# Patient Record
Sex: Male | Born: 1975 | Race: White | Hispanic: No | Marital: Married | Smoking: Current some day smoker
Health system: Southern US, Community
[De-identification: ages and names within clinical notes are randomized; demographics above are authoritative.]

## PROBLEM LIST (undated history)

## (undated) DIAGNOSIS — H04123 Dry eye syndrome of bilateral lacrimal glands: Secondary | ICD-10-CM

## (undated) DIAGNOSIS — G4733 Obstructive sleep apnea (adult) (pediatric): Secondary | ICD-10-CM

## (undated) DIAGNOSIS — I480 Paroxysmal atrial fibrillation: Secondary | ICD-10-CM

## (undated) DIAGNOSIS — N200 Calculus of kidney: Secondary | ICD-10-CM

## (undated) HISTORY — PX: SPINE SURGERY: SHX786

## (undated) HISTORY — DX: Calculus of kidney: N20.0

## (undated) HISTORY — DX: Paroxysmal atrial fibrillation: I48.0

## (undated) HISTORY — PX: OTHER SURGICAL HISTORY: SHX169

## (undated) HISTORY — PX: KNEE ARTHROSCOPY WITH MENISCAL REPAIR: SHX5653

## (undated) HISTORY — DX: Dry eye syndrome of bilateral lacrimal glands: H04.123

## (undated) HISTORY — DX: Obstructive sleep apnea (adult) (pediatric): G47.33

---

## 2000-02-27 ENCOUNTER — Ambulatory Visit (HOSPITAL_COMMUNITY): Admission: RE | Admit: 2000-02-27 | Discharge: 2000-02-27 | Payer: Self-pay | Admitting: Family Medicine

## 2002-07-31 ENCOUNTER — Encounter: Admission: RE | Admit: 2002-07-31 | Discharge: 2002-07-31 | Payer: Self-pay | Admitting: Family Medicine

## 2002-07-31 ENCOUNTER — Encounter: Payer: Self-pay | Admitting: Family Medicine

## 2004-07-21 ENCOUNTER — Emergency Department (HOSPITAL_COMMUNITY): Admission: EM | Admit: 2004-07-21 | Discharge: 2004-07-21 | Payer: Self-pay | Admitting: Emergency Medicine

## 2005-04-17 ENCOUNTER — Ambulatory Visit (HOSPITAL_BASED_OUTPATIENT_CLINIC_OR_DEPARTMENT_OTHER): Admission: RE | Admit: 2005-04-17 | Discharge: 2005-04-17 | Payer: Self-pay | Admitting: Orthopedic Surgery

## 2005-04-17 ENCOUNTER — Ambulatory Visit (HOSPITAL_COMMUNITY): Admission: RE | Admit: 2005-04-17 | Discharge: 2005-04-17 | Payer: Self-pay | Admitting: Orthopedic Surgery

## 2005-06-01 ENCOUNTER — Emergency Department (HOSPITAL_COMMUNITY): Admission: EM | Admit: 2005-06-01 | Discharge: 2005-06-01 | Payer: Self-pay | Admitting: *Deleted

## 2005-06-17 ENCOUNTER — Emergency Department (HOSPITAL_COMMUNITY): Admission: EM | Admit: 2005-06-17 | Discharge: 2005-06-17 | Payer: Self-pay | Admitting: *Deleted

## 2005-07-10 ENCOUNTER — Ambulatory Visit (HOSPITAL_COMMUNITY): Admission: RE | Admit: 2005-07-10 | Discharge: 2005-07-10 | Payer: Self-pay | Admitting: Urology

## 2005-07-10 ENCOUNTER — Ambulatory Visit (HOSPITAL_BASED_OUTPATIENT_CLINIC_OR_DEPARTMENT_OTHER): Admission: RE | Admit: 2005-07-10 | Discharge: 2005-07-10 | Payer: Self-pay | Admitting: Urology

## 2007-05-01 ENCOUNTER — Encounter: Admission: RE | Admit: 2007-05-01 | Discharge: 2007-05-01 | Payer: Self-pay | Admitting: Sports Medicine

## 2007-05-17 ENCOUNTER — Encounter: Admission: RE | Admit: 2007-05-17 | Discharge: 2007-05-17 | Payer: Self-pay | Admitting: Sports Medicine

## 2011-01-06 NOTE — Op Note (Signed)
NAMEFUQUAN, Isaac [name]                ACCOUNT NO.:  [id number]   MEDICAL RECORD NO.:  [id number]          PATIENT TYPE:  AMB   LOCATION:  [facility]                          FACILITY:  [facility]   PHYSICIAN:  Robert A. Thurston Hole, M.D. 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   PROCEDURE:  1.  Left knee examination under anesthesia followed by arthroscopic partial      medial meniscectomy.  2.  Left knee patellofemoral chondroplasty.   SURGEON:  Dr. Salvatore Marvel.   ASSISTANT:  Kirstin Tomasa Rand, P.A.   ANESTHESIA:  General.   OPERATIVE TIME:  30 minutes.   COMPLICATIONS:  None.   INDICATION FOR PROCEDURE:  [name] is a 35 year old gentleman who injured  his left knee March 08, 2005, with a twisting injury.  Significant pain was  examined, MRI documenting medial meniscus tear.  He has had significant pain  and persistent problems with locking and catching with this meniscal tear  confirmed by MRI and is now to undergo arthroscopy.   DESCRIPTION:  [name] was brought to the operating room on April 17, 2005, placed on the operative table in the supine position.  After an  adequate level of general anesthesia was obtained his left knee was  examined.  He had full range of motion and his knee was stable to  ligamentous exam with normal patella tracking.  Left leg was sterilely  injected with 0.25% Marcaine with epinephrine.  Left knee and leg was then  prepped using sterile DuraPrep and draped using sterile technique.  Originally, through an anterolateral portal the arthroscope with a pump  attached was placed and through an anteromedial portal an arthroscopic probe  was placed.  On initial inspection in  medial compartment, the articular  cartilage showed grade 1 and 2 chondromalacia.  The medial meniscus showed  tear of the posterior horn and medial horn, of which 50% was resected back  to a stable rim.  Intercondylar notch inspected, anterior and posterior  cruciate ligaments were normal.  The lateral compartment inspected.  The  articular cartilage was normal, lateral meniscus was normal.  Patellofemoral  joint showed a distinct grade 3 chondral defect on the medial patella facet  and this was debrided, the rest of the patellofemoral joint articular  cartilage was normal, the patella tracked normally.  Medial and lateral  gutters were free of pathology.  After this was done, it was felt that all  pathology had been satisfactorily addressed.  The instruments were removed.  The portal was closed with a 3-0 nylon suture and injected with 0.25%  Marcaine with epinephrine and 4 mg of morphine.  A sterile dressing was  applied and the patient awakened and taken to the recovery room in stable  condition.   FOLLOWUP CARE:  [name] will be followed as an outpatient on  Talwin NX  and Naprosyn.  I will see him back in the office in a week for sutures and  followup.      Robert A. Thurston Hole, M.D.  Electronically Signed     RAW/MEDQ  D:  04/17/2005  T:  04/17/2005  Job:  [id number]

## 2011-01-06 NOTE — Op Note (Signed)
[name], Isaac [name]                ACCOUNT NO.:  [id number]   MEDICAL RECORD NO.:  [id number]          PATIENT TYPE:  AMB   LOCATION:  [facility]                         FACILITY:  [facility]   PHYSICIAN:  Sigmund I. Patsi Sears, M.D.DATE OF BIRTH:  04-19-1976   DATE OF PROCEDURE:  07/10/2005  DATE OF DISCHARGE:                                 OPERATIVE REPORT   PREOPERATIVE DIAGNOSES:  Impacted distal right ureteral calculus x2.   POSTOPERATIVE DIAGNOSES:  Impacted distal right ureteral calculus x2.   OPERATION:  Cystourethroscopy, right retrograde pyelogram with  interpretation, balloon dilation of right lower ureter, right ureteroscopy,  basket extraction of multiple right ureteral calculi.   SURGEON:  Sigmund I. Patsi Sears, M.D.   ANESTHESIA:  General LMA.   PREPARATION:  After appropriate preanesthesia, the patient is brought to the  operating room, placed on the operating table in dorsal supine position  where general LMA anesthesia was introduced. He was then replaced in dorsal  lithotomy position where the pubis was prepped with Betadine solution and  draped in the usual fashion.Marland Kitchen   REVIEW OF HISTORY:  This 35 year old male has a history of multiple right  lower ureteral calculi, which have become impacted, and have not passed. He  is now for basket extraction.   PROCEDURE:  Cystourethroscopy was accomplished which showed a normal penis,  normal urethra, normal prostatic fossa. The bladder base was normal with  edema of the right ureteral orifice. The left ureteral orifice was normal  with clear efflux on the left side. No reflux was seen from the right side.   Right retrograde pyelogram was performed which showed two stones in the  right lower ureter, in the region of the right ureterovesical junction.    The 4 cm balloon dilator was then passed into the right lower ureter and  ureteral orifice, and the ureteral orifices balloon dilated for 3 minutes.  Right ureteroscopy was  then accomplished, and showed three separate stones,  which were each basket extracted individually. One stone popped off the  basket after removal from the penis, and was not recovered at the time of  dictation. The other two stones are sent to laboratory for evaluation. Photo  documentation of the stones was undertaken.   The patient was given a B&O suppository at the beginning of the procedure  and IV Toradol at the end of the procedure. He was awakened and taken to the  recovery room in good condition.   Note, the patient requested no double-J catheter, it was felt that because  of the patient's distal ureteral calculus, and because it was removed easily  after balloon dilation and minimal scoping was necessary, that he may be  able to get by without a double-J catheter.      Sigmund I. Patsi Sears, M.D.  Electronically Signed     SIT/MEDQ  D:  07/10/2005  T:  07/10/2005  Job:  [id number]

## 2014-10-14 ENCOUNTER — Encounter: Payer: Self-pay | Admitting: Sports Medicine

## 2014-10-14 ENCOUNTER — Ambulatory Visit (INDEPENDENT_AMBULATORY_CARE_PROVIDER_SITE_OTHER): Payer: 59 | Admitting: Sports Medicine

## 2014-10-14 VITALS — BP 133/93 | Ht 73.0 in | Wt 225.0 lb

## 2014-10-14 DIAGNOSIS — S29009A Unspecified injury of muscle and tendon of unspecified wall of thorax, initial encounter: Secondary | ICD-10-CM

## 2014-10-14 DIAGNOSIS — S29019A Strain of muscle and tendon of unspecified wall of thorax, initial encounter: Secondary | ICD-10-CM

## 2014-10-14 MED ORDER — METHYLPREDNISOLONE ACETATE 80 MG/ML IJ SUSP
80.0000 mg | Freq: Once | INTRAMUSCULAR | Status: AC
  Administered 2014-10-14: 80 mg via INTRAMUSCULAR

## 2014-10-14 NOTE — Progress Notes (Signed)
   Subjective:    Patient ID: Isaac [name], male    DOB: 04/15/1976, 39 y.o.   MRN: [id number]  HPI chief complaint: Mid back pain  Very pleasant 39 year old male comes in today complaining of one day of right-sided mid back pain. Pain began yesterday morning upon awakening. He states that he was moving a piece of furniture on Monday and he wonders whether or not that may have caused this. He has had intermittent problems with his neck and his back in the past. In fact he sees a local chiropractor quite frequently. His most recent manipulation was late last week. He states that he felt a pop in the same area as his current pain during his manipulation but really didn't have any pain until yesterday morning. He denies radiating pain into his arms or legs. He took some Aleve yesterday which did help some. He did have some spasm initially. He also used a little heat yesterday which seemed to help some. Overall, his pain today is not as severe as it was yesterday. Otherwise healthy No chronic medications Allergic to codeine    Review of Systems     Objective:   Physical Exam Well-developed, well-nourished. No acute distress. Awake alert and oriented 3. Vital signs reviewed.  There is tenderness to palpation just to the right of the T6-T7 area. Mild spasm here as well. No tenderness to palpation along the thoracic or lumbar midline. Reproducible pain with twisting to the left and right. No focal neurological deficits of either upper or lower extremities.       Assessment & Plan:  Mid back pain secondary to simple thoracic strain  80 mg IM Depo-Medrol injection. Continue with moist heat. Continue with massage using a tennis ball. Symptoms should resolve fairly quickly. Follow-up for ongoing or recalcitrant issues.

## 2014-11-09 ENCOUNTER — Encounter: Payer: Self-pay | Admitting: Sports Medicine

## 2014-11-09 ENCOUNTER — Ambulatory Visit (INDEPENDENT_AMBULATORY_CARE_PROVIDER_SITE_OTHER): Payer: 59 | Admitting: Sports Medicine

## 2014-11-09 VITALS — BP 117/76 | Ht 73.0 in | Wt 220.0 lb

## 2014-11-09 DIAGNOSIS — M5010 Cervical disc disorder with radiculopathy, unspecified cervical region: Secondary | ICD-10-CM

## 2014-11-09 DIAGNOSIS — M25531 Pain in right wrist: Secondary | ICD-10-CM

## 2014-11-09 MED ORDER — PREDNISONE (PAK) 10 MG PO TABS: ORAL_TABLET | Freq: Every day | ORAL | Status: DC

## 2014-11-09 NOTE — Progress Notes (Signed)
   Subjective:    Patient ID: Isaac [name], male    DOB: 1975-09-20, 39 y.o.   MRN: [id number]  HPI chief complaint: Right shoulder and arm pain  [name] comes in today complaining of one week of right shoulder and arm pain. Symptoms began after a chiropractic manipulation of his neck. He began to experience an aching discomfort around the right scapula as well as radiating discomfort down the right arm. Symptoms have been present now for a little over a week. He was seen at a local urgent care late last week and prescribed meloxicam and Flexeril and as a result his symptoms have started to improve. He was also given an IM injection of cortisone. He was experiencing some weakness as well but that seems to be improving. He does have a history of a bulging cervical disc which was treated conservatively many years ago. No prior neck surgeries. No deep-seated shoulder pain.  Patient is also complaining of some intermittent ulnar-sided right wrist pain which is present with grip. He describes a popping and burning sensation along the ulnar side of his wrist. It has been present for several years. No swelling. No known trauma. No associated numbness or tingling. No treatment to date. Interim medical history reviewed and unchanged. Medications unchanged Allergies reviewed    Review of Systems As above    Objective:   Physical Exam Well-developed, well-nourished. No acute distress. Awake alert and oriented 3. Vital signs reviewed.  Cervical spine: Full cervical range of motion. No tenderness along the cervical midline. Positive Spurling's to the right. Neurological exam shows full strength in both upper extremities. Reflexes are 2/4 at the biceps, triceps, and brachial radialis tendons bilaterally. No atrophy. Sensation is intact to light-touch distally. Good grip strength.  Right wrist: Full range of motion. No effusion. Slight tenderness to palpation along the course of the ECU tendon but not  markedly. No obvious subluxation or dislocation with wrist rotation. Negative liftoff. No tenderness over the TFCC. Good radial and ulnar pulses.      Assessment & Plan:  Right shoulder and arm pain secondary to cervical radiculopathy Chronic intermittent right wrist pain-question subluxing ECU tendon  The cervical radiculopathy is improving. I will prescribe him a 6 day Sterapred Dosepak for him to have if needed. He will not take this medication unless his symptoms plateau or worsen. We discussed the possibility of physical therapy but he wants to hold on that for now. I do not believe we need any imaging at this time. For his chronic right wrist pain I will give him a body helix compression sleeve to try and if symptoms become more annoying we can look into this further with imaging. Patient will follow-up with me as needed.

## 2016-01-10 ENCOUNTER — Ambulatory Visit (INDEPENDENT_AMBULATORY_CARE_PROVIDER_SITE_OTHER): Payer: 59 | Admitting: Family Medicine

## 2016-01-10 ENCOUNTER — Encounter: Payer: Self-pay | Admitting: Family Medicine

## 2016-01-10 VITALS — BP 117/75 | HR 71 | Temp 98.3°F | Ht 73.0 in | Wt 237.0 lb

## 2016-01-10 DIAGNOSIS — H04123 Dry eye syndrome of bilateral lacrimal glands: Secondary | ICD-10-CM

## 2016-01-10 DIAGNOSIS — N481 Balanitis: Secondary | ICD-10-CM

## 2016-01-10 DIAGNOSIS — M549 Dorsalgia, unspecified: Secondary | ICD-10-CM | POA: Diagnosis not present

## 2016-01-10 MED ORDER — KETOCONAZOLE 2 % EX CREA: 1.0000 "application " | TOPICAL_CREAM | Freq: Two times a day (BID) | CUTANEOUS | Status: DC | PRN

## 2016-01-10 NOTE — Progress Notes (Signed)
Pre visit review using our clinic review tool, if applicable. No additional management support is needed unless otherwise documented below in the visit note. 

## 2016-01-11 ENCOUNTER — Encounter: Payer: Self-pay | Admitting: Family Medicine

## 2016-01-11 DIAGNOSIS — M549 Dorsalgia, unspecified: Secondary | ICD-10-CM | POA: Insufficient documentation

## 2016-01-11 DIAGNOSIS — H04129 Dry eye syndrome of unspecified lacrimal gland: Secondary | ICD-10-CM | POA: Insufficient documentation

## 2016-01-11 NOTE — Progress Notes (Signed)
   Subjective:    Patient ID: Isaac [name], male    DOB: 06/09/1976, 40 y.o.   MRN: [id number]  HPI 40 yr old male to establish with us. He is transfering from the practice of Dr. Melrose NakayamaKindle. His wife is also a patient of mine. He is doing well except for two areas. First he has chronic stiffness and pain in the spine and he takes Naproxen as needed. Also for the past 2 weeks he has had redness and irritation on the head of the penis.    Review of Systems  Constitutional: Negative.   Respiratory: Negative.   Cardiovascular: Negative.   Musculoskeletal: Positive for back pain.  Skin: Positive for rash.       Objective:   Physical Exam  Constitutional: He is oriented to person, place, and time. He appears well-developed and well-nourished.  Cardiovascular: Normal rate, regular rhythm, normal heart sounds and intact distal pulses.   Pulmonary/Chest: Effort normal and breath sounds normal.  Genitourinary:  The glans of the penis is red, no lesions are seen  Neurological: He is alert and oriented to person, place, and time.          Assessment & Plan:  Introductory visit for this man with chronic back pain and now with balanitis. Treat with Ketoconazole cream prn. Nelwyn SalisburyFRY,STEPHEN A, MD

## 2016-01-12 ENCOUNTER — Ambulatory Visit: Payer: 59 | Admitting: Family Medicine

## 2016-01-16 ENCOUNTER — Encounter: Payer: Self-pay | Admitting: Family Medicine

## 2016-01-18 NOTE — Telephone Encounter (Signed)
I would cut back on the cream applications to once a day, or even every other day prn

## 2016-01-20 MED ORDER — FLUCONAZOLE 150 MG PO TABS
150.0000 mg | ORAL_TABLET | Freq: Every day | ORAL | Status: DC
Start: 2016-01-20 — End: 2016-01-21

## 2016-01-20 NOTE — Telephone Encounter (Signed)
Tell him to stop the cream, instead call in Diflucan 150 mg to take one tab daily for 10 days

## 2016-01-20 NOTE — Telephone Encounter (Signed)
[facility], removed cream from current list and spoke with pt.

## 2016-01-21 ENCOUNTER — Telehealth: Payer: Self-pay | Admitting: Family Medicine

## 2016-01-21 MED ORDER — FLUCONAZOLE 100 MG PO TABS: 100.0000 mg | ORAL_TABLET | Freq: Two times a day (BID) | ORAL | Status: DC

## 2016-01-21 NOTE — Telephone Encounter (Signed)
Pt state that the insurance company needs to have a PA for Rx fluconazole and he has sent you the information for that.

## 2016-01-21 NOTE — Telephone Encounter (Signed)
Called pharmacy and spoke to [place] told him to cancel Rx for Diflucan 150 mg and verbal order give for Fluconazole 100 mg BID. Kathlene NovemberMike verbalized understanding.  Called pt and told him Rx changed to Fluconazole 100 mg BID was sent to pharmacy. Pt verbalized understanding.

## 2016-01-21 NOTE — Telephone Encounter (Signed)
This is a duplicate note, see previous one.  

## 2016-01-21 NOTE — Telephone Encounter (Signed)
Please see message and advise 

## 2016-01-21 NOTE — Telephone Encounter (Signed)
Cancel the Diflucan and call in Fluconazole 100 mg to take BID, #60 with no rf

## 2016-01-21 NOTE — Telephone Encounter (Signed)
Pt is asking if something else can be called in since the following med is requiring a PA    fluconazole (DIFLUCAN) 150 MG tablet    Pharmacy   [facility] [address]

## 2016-02-02 ENCOUNTER — Encounter: Payer: Self-pay | Admitting: Family Medicine

## 2016-02-02 NOTE — Telephone Encounter (Signed)
Lets change gears and try a steroid based cream to reduce inflammation. Call in Triamcinolone cream 0.1% to apply bid prn, 45 grams with no rf

## 2016-02-03 MED ORDER — TRIAMCINOLONE 0.1 % CREAM:EUCERIN CREAM 1:1: 1.0000 "application " | TOPICAL_CREAM | Freq: Two times a day (BID) | CUTANEOUS | Status: DC | PRN

## 2016-02-25 ENCOUNTER — Ambulatory Visit (INDEPENDENT_AMBULATORY_CARE_PROVIDER_SITE_OTHER): Payer: 59

## 2016-02-25 ENCOUNTER — Ambulatory Visit (INDEPENDENT_AMBULATORY_CARE_PROVIDER_SITE_OTHER): Payer: 59 | Admitting: Osteopathic Medicine

## 2016-02-25 ENCOUNTER — Telehealth: Payer: Self-pay | Admitting: Family Medicine

## 2016-02-25 VITALS — BP 118/80 | HR 77 | Temp 98.1°F | Resp 16 | Ht 71.25 in | Wt 238.3 lb

## 2016-02-25 DIAGNOSIS — K5900 Constipation, unspecified: Secondary | ICD-10-CM | POA: Diagnosis not present

## 2016-02-25 DIAGNOSIS — R1031 Right lower quadrant pain: Secondary | ICD-10-CM

## 2016-02-25 LAB — POCT CBC
Granulocyte percent: 57.2 %G (ref 37–80)
HCT, POC: 46.6 % (ref 43.5–53.7)
Hemoglobin: 16.2 g/dL (ref 14.1–18.1)
Lymph, poc: 2.5 (ref 0.6–3.4)
MCH, POC: 29.9 pg (ref 27–31.2)
MCHC: 34.8 g/dL (ref 31.8–35.4)
MCV: 85.7 fL (ref 80–97)
MID (cbc): 0.7 (ref 0–0.9)
MPV: 6.9 fL (ref 0–99.8)
POC Granulocyte: 4.3 (ref 2–6.9)
POC LYMPH PERCENT: 33.7 %L (ref 10–50)
POC MID %: 9.1 %M (ref 0–12)
Platelet Count, POC: 241 10*3/uL (ref 142–424)
RBC: 5.44 M/uL (ref 4.69–6.13)
RDW, POC: 13.2 %
WBC: 7.5 10*3/uL (ref 4.6–10.2)

## 2016-02-25 MED ORDER — POLYETHYLENE GLYCOL 3350 17 G PO PACK: 17.0000 g | PACK | Freq: Two times a day (BID) | ORAL | Status: DC | PRN

## 2016-02-25 MED ORDER — BISACODYL 5 MG PO TBEC: 5.0000 mg | DELAYED_RELEASE_TABLET | Freq: Every day | ORAL | Status: DC | PRN

## 2016-02-25 NOTE — Progress Notes (Signed)
HPI: Isaac [name] is a 40 y.o. male who presents to [facility] 02/25/2016 for chief complaint of:  Chief Complaint  Patient presents with  . Abdominal Pain    X 1 WEEK    . Location: RLQ  . Quality: feels like a ball in the abdomen, pressure . Severity: about the same over the past week, not getting worse but not going away . Duration: 1 week or so  . Timing: worse when sitting or standing, Is tender to the touch . Assoc signs/symptoms: Remote history of what sounds like irritable bowel symptoms, on some prescription medications which didn't really help, symptoms aren't bothering him lately. Patient denies diarrhea or severe constipation but he does report going to bathroom a bit less, denies blood in the stool. There is some concern given family history of 2 grandfathers with colon cancer, no first-degree relatives with colon cancer. Patient has no fever, no chills, no nausea or vomiting. No recent travel. No rashes or joint pain. No urinary or penile concerns, no bulging or abnormality in the groin/testicle.    Past medical, social and family history reviewed: Past Medical History  Diagnosis Date  . Kidney stones     sees Dr. Magdalen Spatz   . Chronic dryness of both eyes     sees Dr. Lorin Picket    Past Surgical History  Procedure Laterality Date  . Knee arthroscopy with meniscal repair Left   . Epidural steroid shot      upper and lower spine   . Kidney stones      10 years ago  . Spine surgery      injections   Social History  Substance Use Topics  . Smoking status: Current Some Day Smoker    Types: Cigars  . Smokeless tobacco: Never Used     Comment: occ  . Alcohol Use: 0.0 oz/week    0 Standard drinks or equivalent per week     Comment: occ beer   Family History  Problem Relation Age of Onset  . Breast cancer Mother   . Cancer Mother   . Stroke Mother   . Cancer Maternal Grandfather   . Cancer Paternal Grandfather     Current Outpatient  Prescriptions  Medication Sig Dispense Refill  . Multiple Vitamin (MULTIVITAMIN) tablet Take 1 tablet by mouth.    . naproxen sodium (ANAPROX) 220 MG tablet Take 220 mg by mouth.    . Triamcinolone Acetonide (TRIAMCINOLONE 0.1 % CREAM : EUCERIN) CREA Apply 1 application topically 2 (two) times daily as needed. (Patient not taking: Reported on 02/25/2016) 1 each 0   No current facility-administered medications for this visit.   Allergies  Allergen Reactions  . Codeine Hives      Review of Systems: CONSTITUTIONAL:  No  fever, no chills, No  unintentional weight changes CARDIAC: No  chest pain, No  pressure, No palpitations, No  Orthopnea,  RESPIRATORY: No  cough, No  shortness of breath/wheeze GASTROINTESTINAL: No  nausea, No  vomiting, (+) abdominal pain, No  blood in stool, No  diarrhea, (+) mild constipation  MUSCULOSKELETAL: No  myalgia/arthralgia GENITOURINARY: No  incontinence, No  abnormal genital bleeding/discharge SKIN: No  rash/wounds/concerning lesions   Exam:  BP 118/80 mmHg  Pulse 77  Temp(Src) 98.1 F (36.7 C) (Oral)  Resp 16  Ht 5' 11.25" (1.81 m)  Wt 238 lb 4.8 oz (108.092 kg)  BMI 32.99 kg/m2  SpO2 98% Constitutional: VS see above. General Appearance: alert,  well-developed, well-nourished, NAD Eyes: Normal lids and conjunctive, non-icteric sclera, PERRLA Ears, Nose, Mouth, Throat: MMM Neck: No masses, trachea midline. No thyroid enlargement/tenderness/mass appreciated. No lymphadenopathy Respiratory: Normal respiratory effort. no wheeze, no rhonchi, no rales Cardiovascular: S1/S2 normal, no murmur, no rub/gallop auscultated. RRR. No lower extremity edema. Gastrointestinal: Tenderness to palpation in right lower quadrant but no mass palpable. No hepatomegaly, no splenomegaly. No hernia appreciated. Bowel sounds normal. Tympanic to percussion but no significant distention. No guarding or rebound. Obturator sign negative. When left lower quadrant palpated deeply, no  referred pain to the right upper quadrant. No palpable internal hernia. Rectal exam deferred.  Musculoskeletal: Gait normal. No clubbing/cyanosis of digits.  Neurological: No cranial nerve deficit on limited exam. Motor and sensation intact and symmetric Skin: warm, dry, intact. No rash/ulcer. No concerning nevi or subq nodules on limited exam.   Psychiatric: Normal judgment/insight. Normal mood and affect. Oriented x3.    Results for orders placed or performed in visit on 02/25/16 (from the past 72 hour(s))  POCT CBC     Status: None   Collection Time: 02/25/16  3:58 PM  Result Value Ref Range   WBC 7.5 4.6 - 10.2 K/uL   Lymph, poc 2.5 0.6 - 3.4   POC LYMPH PERCENT 33.7 10 - 50 %L   MID (cbc) 0.7 0 - 0.9   POC MID % 9.1 0 - 12 %M   POC Granulocyte 4.3 2 - 6.9   Granulocyte percent 57.2 37 - 80 %G   RBC 5.44 4.69 - 6.13 M/uL   Hemoglobin 16.2 14.1 - 18.1 g/dL   HCT, POC 86.546.6 78.443.5 - 53.7 %   MCV 85.7 80 - 97 fL   MCH, POC 29.9 27 - 31.2 pg   MCHC 34.8 31.8 - 35.4 g/dL   RDW, POC 69.613.2 %   Platelet Count, POC 241 142 - 424 K/uL   MPV 6.9 0 - 99.8 fL    Imaging personally reviewed, still does appear to be more concentrated on the right lower quadrant Area where the patient is complaining of pain/tenderness, with a relatively normal stool pattern throughout the rest of the visible colon, see below for radiologist over-read.   Dg Abd 1 View  02/25/2016  CLINICAL DATA:  Right lower quadrant abdominal pain for the past week ; history of kidney stones EXAM: ABDOMEN - 1 VIEW COMPARISON:  Abdominal and pelvic CT scan of [facility] of June 08, 2005 FINDINGS: The bowel gas pattern is within the limits of normal. No abnormal soft tissue calcifications are observed overlying the right renal fossa. On the left no definite stones are observed. No ureteral or bladder stones are demonstrated. The bony structures are unremarkable. IMPRESSION: No definite calcified kidney stones are observed. If there is  hematuria or other strong clinical evidence of urinary tract stones, noncontrast CT scanning would be a useful next imaging step. Electronically Signed   By: David  SwazilandJordan M.D.   On: 02/25/2016 15:55      ASSESSMENT/PLAN: Based on lack of elevated white cell count, I have a low suspicion for infectious etiology, symptoms/exam not indicative of acute appendicitis or severe colitis, patient does admit to some constipation issues. Educated on increased walking, oral hydration, increase fiber in the diet, medications as below for laxative use. If he starts to feel worse in terms of pain or bloating, particularly if nausea or vomiting, particularly if fever develops or worsening pain, or if patient fails to improve after treatment with laxatives, would consider  CT to rule out obstruction or possible developing infection.    Right lower quadrant abdominal pain - Plan: POCT CBC, DG Abd 1 View  Constipation, unspecified constipation type - Plan: polyethylene glycol (MIRALAX / GLYCOLAX) packet, bisacodyl (BISACODYL LAXATIVE) 5 MG EC tablet    Visit summary printed and instructions reviewed with the patient. ER/RTC precautions were reviewed. All questions answered. Return if symptoms worsen or fail to improve after laxative treatment.

## 2016-02-25 NOTE — Telephone Encounter (Signed)
Patient Name: Isaac [name] DOB: 1976-03-01 Initial Comment caller states he has abd pain Nurse Assessment Nurse: Charna Elizabethrumbull, RN, Lynden Angathy Date/Time (Eastern Time): 02/25/2016 12:27:18 PM Confirm and document reason for call. If symptomatic, describe symptoms. You must click the next button to save text entered. ---Caller states he developed right, lower abdominal pain about a week ago that is worse today (rated as a 1 on the 1 to 10 scale). No fever. No injury in the past 3 days. No blood in your bowel movements. Alert and responsive. No diarrhea or vomiting. Has the patient traveled out of the country within the last 30 days? ---No Does the patient have any new or worsening symptoms? ---Yes Will a triage be completed? ---Yes Related visit to physician within the last 2 weeks? ---No Does the PT have any chronic conditions? (i.e. diabetes, asthma, etc.) ---Yes List chronic conditions. ---Back problems, Allergies, Penal Yeast infection Is this a behavioral health or substance abuse call? ---No Guidelines Guideline Title Affirmed Question Affirmed Notes Abdominal Pain - Male [1] MILD-MODERATE pain AND [2] constant AND [3] present > 2 hours Final Disposition User See Physician within 4 Hours (or PCP triage) Charna Elizabethrumbull, RN, Cathy Comments No appointments available. He will go to urgent care facility. Referrals [facility] Disagree/Comply: Comply

## 2016-02-25 NOTE — Telephone Encounter (Signed)
Patient checked into urgent care

## 2016-02-25 NOTE — Patient Instructions (Addendum)
IF you received an x-ray today, you will receive an invoice from [facility]. Please contact [facility] at [phone] with questions or concerns regarding your invoice.   IF you received labwork today, you will receive an invoice from [facility]. Please contact [facility] at [phone] with questions or concerns regarding your invoice.   Our billing staff will not be able to assist you with questions regarding bills from these companies.  You will be contacted with the lab results as soon as they are available. The fastest way to get your results is to activate your My Chart account. Instructions are located on the last page of this paperwork. If you have not heard from us regarding the results in 2 weeks, please contact this office.    Constipation, Adult Constipation is when a person has fewer than three bowel movements a week, has difficulty having a bowel movement, or has stools that are dry, hard, or larger than normal. As people grow older, constipation is more common. A low-fiber diet, not taking in enough fluids, and taking certain medicines may make constipation worse.  CAUSES   Certain medicines, such as antidepressants, pain medicine, iron supplements, antacids, and water pills.   Certain diseases, such as diabetes, irritable bowel syndrome (IBS), thyroid disease, or depression.   Not drinking enough water.   Not eating enough fiber-rich foods.   Stress or travel.   Lack of physical activity or exercise.   Ignoring the urge to have a bowel movement.   Using laxatives too much.  SIGNS AND SYMPTOMS   Having fewer than three bowel movements a week.   Straining to have a bowel movement.   Having stools that are hard, dry, or larger than normal.   Feeling full or bloated.   Pain in the lower abdomen.   Not feeling relief after having a bowel movement.  DIAGNOSIS  Your health care provider will take a  medical history and perform a physical exam. Further testing may be done for severe constipation. Some tests may include:  A barium enema X-ray to examine your rectum, colon, and, sometimes, your small intestine.   A sigmoidoscopy to examine your lower colon.   A colonoscopy to examine your entire colon.  A CT scan to evaluate other causes of abdominal pain or obstruction.  TREATMENT  Treatment will depend on the severity of your constipation and what is causing it. Some dietary treatments include drinking more fluids and eating more fiber-rich foods. Lifestyle treatments may include regular exercise. If these diet and lifestyle recommendations do not help, your health care provider may recommend taking over-the-counter laxative medicines to help you have bowel movements. Prescription medicines may be prescribed if over-the-counter medicines do not work.  HOME CARE INSTRUCTIONS   Eat foods that have a lot of fiber, such as fruits, vegetables, whole grains, and beans.  Limit foods high in fat and processed sugars, such as french fries, hamburgers, cookies, candies, and soda.   A fiber supplement may be added to your diet if you cannot get enough fiber from foods.   Drink enough fluids to keep your urine clear or pale yellow.   Exercise regularly or as directed by your health care provider.   Go to the restroom when you have the urge to go. Do not hold it.   Only take over-the-counter or prescription medicines as directed by your health care provider. Do not take other medicines for constipation without talking to your health care  provider first.  SEEK IMMEDIATE MEDICAL CARE IF:   You have bright red blood in your stool.   Your constipation lasts for more than 4 days or gets worse.   You have abdominal or rectal pain.   You have thin, pencil-like stools.   You have unexplained weight loss. MAKE SURE YOU:   Understand these instructions.  Will watch your  condition.  Will get help right away if you are not doing well or get worse.   This information is not intended to replace advice given to you by your health care provider. Make sure you discuss any questions you have with your health care provider.   Document Released: 05/05/2004 Document Revised: 08/28/2014 Document Reviewed: 05/19/2013 Elsevier Interactive Patient Education [employer]2016 Elsevier Inc.

## 2016-02-28 ENCOUNTER — Ambulatory Visit: Payer: 59 | Admitting: Family Medicine

## 2016-03-29 ENCOUNTER — Ambulatory Visit (INDEPENDENT_AMBULATORY_CARE_PROVIDER_SITE_OTHER): Payer: 59 | Admitting: Family Medicine

## 2016-03-29 ENCOUNTER — Telehealth: Payer: Self-pay | Admitting: Family Medicine

## 2016-03-29 DIAGNOSIS — Z23 Encounter for immunization: Secondary | ICD-10-CM | POA: Diagnosis not present

## 2016-03-29 NOTE — Telephone Encounter (Signed)
Error/ltd ° °

## 2016-04-17 ENCOUNTER — Other Ambulatory Visit (INDEPENDENT_AMBULATORY_CARE_PROVIDER_SITE_OTHER): Payer: 59

## 2016-04-17 DIAGNOSIS — Z0000 Encounter for general adult medical examination without abnormal findings: Secondary | ICD-10-CM | POA: Diagnosis not present

## 2016-04-17 LAB — BASIC METABOLIC PANEL
BUN: 15 mg/dL (ref 6–23)
CO2: 28 mEq/L (ref 19–32)
Calcium: 8.9 mg/dL (ref 8.4–10.5)
Chloride: 102 mEq/L (ref 96–112)
Creatinine, Ser: 1.06 mg/dL (ref 0.40–1.50)
GFR: 82.06 mL/min (ref >60.00)
Glucose, Bld: 91 mg/dL (ref 70–99)
Potassium: 4.3 mEq/L (ref 3.5–5.1)
Sodium: 139 mEq/L (ref 135–145)

## 2016-04-17 LAB — POC URINALSYSI DIPSTICK (AUTOMATED)
Bilirubin, UA: NEGATIVE
Blood, UA: NEGATIVE
Glucose, UA: NEGATIVE
Ketones, UA: NEGATIVE
Leukocytes, UA: NEGATIVE
Nitrite, UA: NEGATIVE
Protein, UA: NEGATIVE
Spec Grav, UA: 1.02
Urobilinogen, UA: 0.2
pH, UA: 5.5

## 2016-04-17 LAB — HEPATIC FUNCTION PANEL
ALT: 28 U/L (ref 0–53)
AST: 17 U/L (ref 0–37)
Albumin: 4.1 g/dL (ref 3.5–5.2)
Alkaline Phosphatase: 84 U/L (ref 39–117)
Bilirubin, Direct: 0.1 mg/dL (ref 0.0–0.3)
Total Bilirubin: 0.6 mg/dL (ref 0.2–1.2)
Total Protein: 7.3 g/dL (ref 6.0–8.3)

## 2016-04-17 LAB — CBC WITH DIFFERENTIAL/PLATELET
Basophils Absolute: 0.1 10*3/uL (ref 0.0–0.1)
Basophils Relative: 1 % (ref 0.0–3.0)
Eosinophils Absolute: 0.4 10*3/uL (ref 0.0–0.7)
Eosinophils Relative: 5.9 % — ABNORMAL HIGH (ref 0.0–5.0)
HCT: 47.7 % (ref 39.0–52.0)
Hemoglobin: 16.3 g/dL (ref 13.0–17.0)
Lymphocytes Relative: 28.5 % (ref 12.0–46.0)
Lymphs Abs: 2 10*3/uL (ref 0.7–4.0)
MCHC: 34.1 g/dL (ref 30.0–36.0)
MCV: 87 fl (ref 78.0–100.0)
Monocytes Absolute: 0.7 10*3/uL (ref 0.1–1.0)
Monocytes Relative: 10.5 % (ref 3.0–12.0)
Neutro Abs: 3.8 10*3/uL (ref 1.4–7.7)
Neutrophils Relative %: 54.1 % (ref 43.0–77.0)
Platelets: 253 10*3/uL (ref 150.0–400.0)
RBC: 5.48 Mil/uL (ref 4.22–5.81)
RDW: 13.4 % (ref 11.5–15.5)
WBC: 7.1 10*3/uL (ref 4.0–10.5)

## 2016-04-17 LAB — LIPID PANEL
Cholesterol: 263 mg/dL — ABNORMAL HIGH (ref 0–200)
HDL: 54.8 mg/dL (ref >39.00)
LDL Cholesterol: 172 mg/dL — ABNORMAL HIGH (ref 0–99)
NonHDL: 208.34
Total CHOL/HDL Ratio: 5
Triglycerides: 181 mg/dL — ABNORMAL HIGH (ref 0.0–149.0)
VLDL: 36.2 mg/dL (ref 0.0–40.0)

## 2016-04-17 LAB — TSH: TSH: 2.36 u[IU]/mL (ref 0.35–4.50)

## 2016-04-26 ENCOUNTER — Encounter: Payer: 59 | Admitting: Family Medicine

## 2016-04-26 ENCOUNTER — Encounter: Payer: Self-pay | Admitting: Family Medicine

## 2016-04-26 ENCOUNTER — Ambulatory Visit (INDEPENDENT_AMBULATORY_CARE_PROVIDER_SITE_OTHER): Payer: 59 | Admitting: Family Medicine

## 2016-04-26 VITALS — BP 108/72 | HR 72 | Temp 98.1°F | Ht 71.25 in | Wt 235.0 lb

## 2016-04-26 DIAGNOSIS — Z23 Encounter for immunization: Secondary | ICD-10-CM

## 2016-04-26 DIAGNOSIS — Z0000 Encounter for general adult medical examination without abnormal findings: Secondary | ICD-10-CM | POA: Diagnosis not present

## 2016-04-26 DIAGNOSIS — E782 Mixed hyperlipidemia: Secondary | ICD-10-CM | POA: Diagnosis not present

## 2016-04-26 NOTE — Progress Notes (Signed)
   Subjective:    Patient ID: Isaac [name], male    DOB: 31-Mar-1976, 40 y.o.   MRN: [id number]  HPI 40 yr old male for a well exam. He has no concerns. He and his wife had a baby last week so he is excited about this.    Review of Systems  Constitutional: Negative.   HENT: Negative.   Eyes: Negative.   Respiratory: Negative.   Cardiovascular: Negative.   Gastrointestinal: Negative.   Genitourinary: Negative.   Musculoskeletal: Negative.   Skin: Negative.   Neurological: Negative.   Psychiatric/Behavioral: Negative.        Objective:   Physical Exam  Constitutional: He is oriented to person, place, and time. He appears well-developed and well-nourished. No distress.  HENT:  Head: Normocephalic and atraumatic.  Right Ear: External ear normal.  Left Ear: External ear normal.  Nose: Nose normal.  Mouth/Throat: Oropharynx is clear and moist. No oropharyngeal exudate.  Eyes: Conjunctivae and EOM are normal. Pupils are equal, round, and reactive to light. Right eye exhibits no discharge. Left eye exhibits no discharge. No scleral icterus.  Neck: Neck supple. No JVD present. No tracheal deviation present. No thyromegaly present.  Cardiovascular: Normal rate, regular rhythm, normal heart sounds and intact distal pulses.  Exam reveals no gallop and no friction rub.   No murmur heard. Pulmonary/Chest: Effort normal and breath sounds normal. No respiratory distress. He has no wheezes. He has no rales. He exhibits no tenderness.  Abdominal: Soft. Bowel sounds are normal. He exhibits no distension and no mass. There is no tenderness. There is no rebound and no guarding.  Genitourinary: Rectum normal, prostate normal and penis normal. Rectal exam shows guaiac negative stool. No penile tenderness.  Musculoskeletal: Normal range of motion. He exhibits no edema or tenderness.  Lymphadenopathy:    He has no cervical adenopathy.  Neurological: He is alert and oriented to person, place, and  time. He has normal reflexes. No cranial nerve deficit. He exhibits normal muscle tone. Coordination normal.  Skin: Skin is warm and dry. No rash noted. He is not diaphoretic. No erythema. No pallor.  Psychiatric: He has a normal mood and affect. His behavior is normal. Judgment and thought content normal.          Assessment & Plan:  Well exam. We discussed diet and exercise. He will reduce his intake of fatty foods and we will recheck a lipid panel in 6 months.  Nelwyn SalisburyFRY,Danayah Smyre A, MD

## 2016-04-26 NOTE — Progress Notes (Signed)
Pre visit review using our clinic review tool, if applicable. No additional management support is needed unless otherwise documented below in the visit note. 

## 2016-06-22 ENCOUNTER — Encounter: Payer: Self-pay | Admitting: Sports Medicine

## 2016-06-22 ENCOUNTER — Ambulatory Visit (INDEPENDENT_AMBULATORY_CARE_PROVIDER_SITE_OTHER): Payer: 59 | Admitting: Sports Medicine

## 2016-06-22 VITALS — BP 113/75 | Ht 72.0 in | Wt 230.0 lb

## 2016-06-22 DIAGNOSIS — M5010 Cervical disc disorder with radiculopathy, unspecified cervical region: Secondary | ICD-10-CM | POA: Diagnosis not present

## 2016-06-22 MED ORDER — MELOXICAM 15 MG PO TABS: ORAL_TABLET | ORAL | 1 refills | Status: DC

## 2016-06-22 MED ORDER — CYCLOBENZAPRINE HCL 10 MG PO TABS
10.0000 mg | ORAL_TABLET | Freq: Every evening | ORAL | 0 refills | Status: DC | PRN
Start: 2016-06-22 — End: 2018-12-11

## 2016-06-22 NOTE — Progress Notes (Signed)
   Subjective:    Patient ID: Bebe Litereter A Barish, male    DOB: 11-Dec-1975, 40 y.o.   MRN: [id number]  HPI chief complaint: Upper back pain  Patient is a 40 year old male who comes in today with returning upper back pain. This is a chronic intermittent problem for him. He was last seen in our office in March 2016 for similar issue. His pain resolved with meloxicam and Flexeril. His current episode started a couple of days ago and got worse this morning. His pain is primarily between the scapula, right greater than left. He is also getting some spasm. No radiating pain into his arms. No numbness or tingling.  Interim medical history reviewed Medications reviewed Allergies reviewed     Review of Systems    As above  Objective:   Physical Exam  Well-developed, well-nourished. No acute distress. Awake alert and oriented 3.  Cervical spine: Full cervical range of motion. He does have reproducible parascapular pain on the right with cervical rotation in this direction. Diffuse tenderness to palpation along the parascapular areas bilaterally, right greater than left. Mild spasm of the right rhomboids.  Neurological exam: Full strength in both upper extremities. Reflexes are equal at the biceps, triceps, and brachial radialis tendons. Sensation is intact to light touch grossly.      Assessment & Plan:  Returning parascapular pain likely secondary to cervical radiculopathy  Patient has been treated in the past with IM Depo-Medrol injections, prednisone, meloxicam, and Flexeril. He feels like the meloxicam has been the greatest help. I've given him a refill with instructions to take 15 mg daily for 7 days then as needed. I've also given him a refill on his Flexeril to take 10 mg daily at bedtime as needed for pain and spasm. He will limit his activity based on his symptoms until they resolve. Follow-up with me for ongoing or recalcitrant issues.

## 2016-08-29 ENCOUNTER — Ambulatory Visit (HOSPITAL_BASED_OUTPATIENT_CLINIC_OR_DEPARTMENT_OTHER)
Admission: RE | Admit: 2016-08-29 | Discharge: 2016-08-29 | Disposition: A | Discharge status: Home / Self Care | Payer: 59 | Source: Ambulatory Visit | Attending: Podiatry | Admitting: Podiatry

## 2016-08-29 ENCOUNTER — Encounter: Payer: Self-pay | Admitting: Podiatry

## 2016-08-29 ENCOUNTER — Ambulatory Visit (INDEPENDENT_AMBULATORY_CARE_PROVIDER_SITE_OTHER): Payer: 59 | Admitting: Podiatry

## 2016-08-29 DIAGNOSIS — L600 Ingrowing nail: Secondary | ICD-10-CM | POA: Diagnosis not present

## 2016-08-29 DIAGNOSIS — M79671 Pain in right foot: Secondary | ICD-10-CM | POA: Insufficient documentation

## 2016-08-29 DIAGNOSIS — M7741 Metatarsalgia, right foot: Secondary | ICD-10-CM

## 2016-08-29 DIAGNOSIS — M779 Enthesopathy, unspecified: Secondary | ICD-10-CM

## 2016-08-29 DIAGNOSIS — M79672 Pain in left foot: Secondary | ICD-10-CM | POA: Diagnosis present

## 2016-08-29 NOTE — Progress Notes (Signed)
   Subjective:    Patient ID: Bebe Litereter A Marchitto, male    DOB: 1976/06/10, 41 y.o.   MRN: [id number]  HPI  41 year old male persists the office today for concerns of pain to the right foot and he points along submetatarsal to reduce the majority of symptoms. This started suddenly on Friday. He denies any recent change or increase in activity. He had a root canal on Friday for the pain started but no other inciting incident that he can recall.he did take meloxicam one day however this did not help his right foot. Denies any numbness or tingling.Over the last couple weeks she has noticed on his left fourth toe has become itching as well as somewhat painful along the nail. Denies any drainage or pus. No treatment for this otherwise. No other complaints.  Review of Systems  All other systems reviewed and are negative.      Objective:   Physical Exam General: AAO x3, NAD  Dermatological:There is mild incurvation along the lateral nail border of the left fourth digit toenail most localized edema and erythema. There is no drainage or pus. There is no ascending cellulitis. There is no fluctuan crepitus or malodor. No other open lesions or pre-ulcer lesions identified.  Vascular: Dorsalis Pedis artery and Posterior Tibial artery pedal pulses are 2/4 bilateral with immedate capillary fill time. Pedal hair growth present. No varicosities and no lower extremity edema present bilateral. There is no pain with calf compression, swelling, warmth, erythema.   Neruologic: Grossly intact via light touch bilateral. Vibratory intact via tuning fork bilateral. Protective threshold with Semmes Wienstein monofilament intact to all pedal sites bilateral.   Musculoskeletal: Hammertoe contractures are present as well as adductovarus the fourth and fifth toes bilaterally. There is tenderness the right foot along the plantar aspect of the second metatarsal phalangeal joint. There is no pain with MPJ range of motion. There is  mild edema to this area there is no erythema or increase in warmth. There is no tenderness the dorsal aspect of the metatarsal and digital to the joint.There is no pain the vibratory sensation. There is mild restriction of right hallux first MPJ range of motion and dorsiflexion. Muscular strength 5/5 in all groups tested bilateral.  Gait: Unassisted, Nonantalgic.      Assessment & Plan:  41 year old male right second MPJ capsulitis; left fourth digit ingrown toenail -Treatment options discussed including all alternatives, risks, and complications -Etiology of symptoms were discussed -X-rays were obtained and reviewed with the patient.  -Discuss a steroid injection into the right second MPJ and around the area. He wishes to proceed with this. Risks and complications were discussed. Under sterile conditions a mixture of dexamethasone and local anesthetic was infiltrated into and around the second MPJ without complications. Post injection care was discussed. -Continue mobic  -Metatarsal offloading pads -Left 4th digit toenail was debrided to remove the symptomatic portion of the ingrown toenail. Antibiotic ointment and a bandage was applied. Epsom salts soaks. If not improved will likely need partial nail avulsion.  -Follow-up in 3 weeks or sooner if any problems arise. In the meantime, encouraged to call the office with any questions, concerns, change in symptoms.   Ovid CurdMatthew Wagoner, DPM

## 2017-04-25 ENCOUNTER — Encounter: Payer: Self-pay | Admitting: Family Medicine

## 2017-04-25 NOTE — Telephone Encounter (Signed)
I understand. I have also heard good things about Dr. Larey DresserMartha Ajlouny at [facility]. I can refer him if he wishes

## 2017-04-25 NOTE — Telephone Encounter (Signed)
He is currently seeing the [facility], so he couldn't see another podiatrist in that group. I would suggest he see an Orthopedist that specializes in the lower extremities. Either Dr. Aldean BakerMarcus Duda or Dr. Toni ArthursJohn Hewitt would be good for him to see. I can do a referral if he wishes

## 2017-04-30 ENCOUNTER — Encounter: Payer: Self-pay | Admitting: Family Medicine

## 2017-04-30 ENCOUNTER — Ambulatory Visit (INDEPENDENT_AMBULATORY_CARE_PROVIDER_SITE_OTHER): Payer: 59 | Admitting: Family Medicine

## 2017-04-30 VITALS — BP 111/78 | HR 69 | Temp 98.6°F | Ht 72.0 in | Wt 230.0 lb

## 2017-04-30 DIAGNOSIS — Z23 Encounter for immunization: Secondary | ICD-10-CM | POA: Diagnosis not present

## 2017-04-30 DIAGNOSIS — Z0000 Encounter for general adult medical examination without abnormal findings: Secondary | ICD-10-CM | POA: Diagnosis not present

## 2017-04-30 DIAGNOSIS — M1000 Idiopathic gout, unspecified site: Secondary | ICD-10-CM

## 2017-04-30 DIAGNOSIS — M109 Gout, unspecified: Secondary | ICD-10-CM | POA: Insufficient documentation

## 2017-04-30 LAB — BASIC METABOLIC PANEL
BUN: 16 mg/dL (ref 6–23)
CO2: 28 mEq/L (ref 19–32)
Calcium: 9.6 mg/dL (ref 8.4–10.5)
Chloride: 101 mEq/L (ref 96–112)
Creatinine, Ser: 1.14 mg/dL (ref 0.40–1.50)
GFR: 75.07 mL/min (ref >60.00)
Glucose, Bld: 92 mg/dL (ref 70–99)
Potassium: 4.1 mEq/L (ref 3.5–5.1)
Sodium: 137 mEq/L (ref 135–145)

## 2017-04-30 LAB — POC URINALSYSI DIPSTICK (AUTOMATED)
Bilirubin, UA: NEGATIVE
Blood, UA: NEGATIVE
Glucose, UA: NEGATIVE
Ketones, UA: NEGATIVE
Leukocytes, UA: NEGATIVE
Nitrite, UA: NEGATIVE
Protein, UA: NEGATIVE
Spec Grav, UA: 1.01 (ref 1.010–1.025)
Urobilinogen, UA: 0.2 E.U./dL
pH, UA: 6 (ref 5.0–8.0)

## 2017-04-30 LAB — LIPID PANEL
Cholesterol: 229 mg/dL — ABNORMAL HIGH (ref 0–200)
HDL: 47.2 mg/dL (ref >39.00)
LDL Cholesterol: 149 mg/dL — ABNORMAL HIGH (ref 0–99)
NonHDL: 182.2
Total CHOL/HDL Ratio: 5
Triglycerides: 167 mg/dL — ABNORMAL HIGH (ref 0.0–149.0)
VLDL: 33.4 mg/dL (ref 0.0–40.0)

## 2017-04-30 LAB — CBC WITH DIFFERENTIAL/PLATELET
Basophils Absolute: 0.1 10*3/uL (ref 0.0–0.1)
Basophils Relative: 1 % (ref 0.0–3.0)
Eosinophils Absolute: 0.1 10*3/uL (ref 0.0–0.7)
Eosinophils Relative: 2.5 % (ref 0.0–5.0)
HCT: 47.4 % (ref 39.0–52.0)
Hemoglobin: 15.9 g/dL (ref 13.0–17.0)
Lymphocytes Relative: 27.9 % (ref 12.0–46.0)
Lymphs Abs: 1.5 10*3/uL (ref 0.7–4.0)
MCHC: 33.5 g/dL (ref 30.0–36.0)
MCV: 90.4 fl (ref 78.0–100.0)
Monocytes Absolute: 0.6 10*3/uL (ref 0.1–1.0)
Monocytes Relative: 12.1 % — ABNORMAL HIGH (ref 3.0–12.0)
Neutro Abs: 3 10*3/uL (ref 1.4–7.7)
Neutrophils Relative %: 56.5 % (ref 43.0–77.0)
Platelets: 245 10*3/uL (ref 150.0–400.0)
RBC: 5.24 Mil/uL (ref 4.22–5.81)
RDW: 13 % (ref 11.5–15.5)
WBC: 5.3 10*3/uL (ref 4.0–10.5)

## 2017-04-30 LAB — HEPATIC FUNCTION PANEL
ALT: 25 U/L (ref 0–53)
AST: 17 U/L (ref 0–37)
Albumin: 4.3 g/dL (ref 3.5–5.2)
Alkaline Phosphatase: 77 U/L (ref 39–117)
Bilirubin, Direct: 0.1 mg/dL (ref 0.0–0.3)
Total Bilirubin: 0.7 mg/dL (ref 0.2–1.2)
Total Protein: 6.9 g/dL (ref 6.0–8.3)

## 2017-04-30 LAB — TSH: TSH: 3.49 u[IU]/mL (ref 0.35–4.50)

## 2017-04-30 NOTE — Patient Instructions (Signed)
WE NOW OFFER   [facility] FAST TRACK!!!  SAME DAY Appointments for ACUTE CARE  Such as: Sprains, Injuries, cuts, abrasions, rashes, muscle pain, joint pain, back pain Colds, flu, sore throats, headache, allergies, cough, fever  Ear pain, sinus and eye infections Abdominal pain, nausea, vomiting, diarrhea, upset stomach Animal/insect bites  3 Easy Ways to Schedule: Walk-In Scheduling Call in scheduling Mychart Sign-up: [email or web address]         

## 2017-04-30 NOTE — Progress Notes (Signed)
   Subjective:    Patient ID: Isaac [name], male    DOB: September 27, 1975, 41 y.o.   MRN: [id number]  HPI Here for a well exam. He feels fine. He has been troubled with left foot pain and he has seen several podiatrists this year. Most recently he has been seeing Dr. Merwyn KatosJohn Petery, and he diagnosed him with gout. He had an elevated uric acid level and apparently this has been reduced to normal by taking Allopurinol.    Review of Systems  Constitutional: Negative.   HENT: Negative.   Eyes: Negative.   Respiratory: Negative.   Cardiovascular: Negative.   Gastrointestinal: Negative.   Genitourinary: Negative.   Musculoskeletal: Positive for arthralgias. Negative for back pain, gait problem, joint swelling, myalgias, neck pain and neck stiffness.  Skin: Negative.   Neurological: Negative.   Psychiatric/Behavioral: Negative.        Objective:   Physical Exam  Constitutional: He is oriented to person, place, and time. He appears well-developed and well-nourished. No distress.  HENT:  Head: Normocephalic and atraumatic.  Right Ear: External ear normal.  Left Ear: External ear normal.  Nose: Nose normal.  Mouth/Throat: Oropharynx is clear and moist. No oropharyngeal exudate.  Eyes: Pupils are equal, round, and reactive to light. Conjunctivae and EOM are normal. Right eye exhibits no discharge. Left eye exhibits no discharge. No scleral icterus.  Neck: Neck supple. No JVD present. No tracheal deviation present. No thyromegaly present.  Cardiovascular: Normal rate, regular rhythm, normal heart sounds and intact distal pulses.  Exam reveals no gallop and no friction rub.   No murmur heard. Pulmonary/Chest: Effort normal and breath sounds normal. No respiratory distress. He has no wheezes. He has no rales. He exhibits no tenderness.  Abdominal: Soft. Bowel sounds are normal. He exhibits no distension and no mass. There is no tenderness. There is no rebound and no guarding.  Genitourinary: Rectum  normal, prostate normal and penis normal. Rectal exam shows guaiac negative stool. No penile tenderness.  Musculoskeletal: Normal range of motion. He exhibits no edema or tenderness.  Lymphadenopathy:    He has no cervical adenopathy.  Neurological: He is alert and oriented to person, place, and time. He has normal reflexes. No cranial nerve deficit. He exhibits normal muscle tone. Coordination normal.  Skin: Skin is warm and dry. No rash noted. He is not diaphoretic. No erythema. No pallor.  Psychiatric: He has a normal mood and affect. His behavior is normal. Judgment and thought content normal.          Assessment & Plan:  Well exam. We discussed diet and exercise. Get fasting labs.  Gershon CraneStephen Fry, MD

## 2017-05-10 ENCOUNTER — Encounter: Payer: Self-pay | Admitting: Family Medicine

## 2017-09-22 DIAGNOSIS — F4323 Adjustment disorder with mixed anxiety and depressed mood: Secondary | ICD-10-CM | POA: Diagnosis not present

## 2017-11-26 ENCOUNTER — Encounter: Payer: Self-pay | Admitting: Family Medicine

## 2017-11-30 NOTE — Telephone Encounter (Signed)
I recommend Dr. Karen Gould  

## 2017-12-06 DIAGNOSIS — K921 Melena: Secondary | ICD-10-CM | POA: Diagnosis not present

## 2017-12-06 DIAGNOSIS — R140 Abdominal distension (gaseous): Secondary | ICD-10-CM | POA: Diagnosis not present

## 2017-12-06 DIAGNOSIS — K219 Gastro-esophageal reflux disease without esophagitis: Secondary | ICD-10-CM | POA: Diagnosis not present

## 2017-12-06 DIAGNOSIS — R1031 Right lower quadrant pain: Secondary | ICD-10-CM | POA: Diagnosis not present

## 2018-01-08 DIAGNOSIS — K648 Other hemorrhoids: Secondary | ICD-10-CM | POA: Diagnosis not present

## 2018-01-08 DIAGNOSIS — R1030 Lower abdominal pain, unspecified: Secondary | ICD-10-CM | POA: Diagnosis not present

## 2018-01-08 DIAGNOSIS — K921 Melena: Secondary | ICD-10-CM | POA: Diagnosis not present

## 2018-01-08 DIAGNOSIS — K635 Polyp of colon: Secondary | ICD-10-CM | POA: Diagnosis not present

## 2018-01-11 DIAGNOSIS — K635 Polyp of colon: Secondary | ICD-10-CM | POA: Diagnosis not present

## 2018-01-16 DIAGNOSIS — D1801 Hemangioma of skin and subcutaneous tissue: Secondary | ICD-10-CM | POA: Diagnosis not present

## 2018-01-16 DIAGNOSIS — D485 Neoplasm of uncertain behavior of skin: Secondary | ICD-10-CM | POA: Diagnosis not present

## 2018-01-16 DIAGNOSIS — L821 Other seborrheic keratosis: Secondary | ICD-10-CM | POA: Diagnosis not present

## 2018-01-16 DIAGNOSIS — D225 Melanocytic nevi of trunk: Secondary | ICD-10-CM | POA: Diagnosis not present

## 2018-02-14 DIAGNOSIS — F4323 Adjustment disorder with mixed anxiety and depressed mood: Secondary | ICD-10-CM | POA: Diagnosis not present

## 2018-04-02 ENCOUNTER — Ambulatory Visit (INDEPENDENT_AMBULATORY_CARE_PROVIDER_SITE_OTHER): Payer: BLUE CROSS/BLUE SHIELD | Admitting: Family Medicine

## 2018-04-02 ENCOUNTER — Encounter: Payer: Self-pay | Admitting: Family Medicine

## 2018-04-02 VITALS — BP 120/78 | HR 73 | Temp 98.1°F | Wt 236.7 lb

## 2018-04-02 DIAGNOSIS — J019 Acute sinusitis, unspecified: Secondary | ICD-10-CM

## 2018-04-02 MED ORDER — AZITHROMYCIN 250 MG PO TABS: ORAL_TABLET | ORAL | 0 refills | Status: DC

## 2018-04-02 NOTE — Progress Notes (Signed)
   Subjective:    Patient ID: Isaac [name], male    DOB: 10/11/1975, 42 y.o.   MRN: [id number]  HPI Here for 2 weeks of stuffy head, sinus pressure, PND, and a dry cough. No fever. Using Flonase.   Review of Systems  Constitutional: Negative.   HENT: Positive for congestion, postnasal drip, sinus pressure and sinus pain. Negative for sore throat.   Eyes: Negative.   Respiratory: Positive for cough.        Objective:   Physical Exam  Constitutional: He appears well-developed and well-nourished.  HENT:  Right Ear: External ear normal.  Left Ear: External ear normal.  Nose: Nose normal.  Mouth/Throat: Oropharynx is clear and moist.  Eyes: Conjunctivae are normal.  Neck: No thyromegaly present.  Pulmonary/Chest: Effort normal and breath sounds normal. No stridor. No respiratory distress. He has no wheezes. He has no rales.  Lymphadenopathy:    He has no cervical adenopathy.          Assessment & Plan:  Sinusitis, treat with a Zpack and Mucinex D. Gershon CraneStephen Fry, MD

## 2018-04-09 DIAGNOSIS — M546 Pain in thoracic spine: Secondary | ICD-10-CM | POA: Diagnosis not present

## 2018-04-09 DIAGNOSIS — M5033 Other cervical disc degeneration, cervicothoracic region: Secondary | ICD-10-CM | POA: Diagnosis not present

## 2018-04-09 DIAGNOSIS — M9902 Segmental and somatic dysfunction of thoracic region: Secondary | ICD-10-CM | POA: Diagnosis not present

## 2018-04-09 DIAGNOSIS — M9901 Segmental and somatic dysfunction of cervical region: Secondary | ICD-10-CM | POA: Diagnosis not present

## 2018-04-11 DIAGNOSIS — M9901 Segmental and somatic dysfunction of cervical region: Secondary | ICD-10-CM | POA: Diagnosis not present

## 2018-04-11 DIAGNOSIS — M9902 Segmental and somatic dysfunction of thoracic region: Secondary | ICD-10-CM | POA: Diagnosis not present

## 2018-04-11 DIAGNOSIS — M5033 Other cervical disc degeneration, cervicothoracic region: Secondary | ICD-10-CM | POA: Diagnosis not present

## 2018-04-11 DIAGNOSIS — M546 Pain in thoracic spine: Secondary | ICD-10-CM | POA: Diagnosis not present

## 2018-04-16 DIAGNOSIS — M9901 Segmental and somatic dysfunction of cervical region: Secondary | ICD-10-CM | POA: Diagnosis not present

## 2018-04-16 DIAGNOSIS — M9903 Segmental and somatic dysfunction of lumbar region: Secondary | ICD-10-CM | POA: Diagnosis not present

## 2018-04-16 DIAGNOSIS — M545 Low back pain: Secondary | ICD-10-CM | POA: Diagnosis not present

## 2018-04-16 DIAGNOSIS — M5033 Other cervical disc degeneration, cervicothoracic region: Secondary | ICD-10-CM | POA: Diagnosis not present

## 2018-04-23 DIAGNOSIS — M9903 Segmental and somatic dysfunction of lumbar region: Secondary | ICD-10-CM | POA: Diagnosis not present

## 2018-04-23 DIAGNOSIS — M5033 Other cervical disc degeneration, cervicothoracic region: Secondary | ICD-10-CM | POA: Diagnosis not present

## 2018-04-23 DIAGNOSIS — M545 Low back pain: Secondary | ICD-10-CM | POA: Diagnosis not present

## 2018-04-23 DIAGNOSIS — M9901 Segmental and somatic dysfunction of cervical region: Secondary | ICD-10-CM | POA: Diagnosis not present

## 2018-05-07 DIAGNOSIS — M545 Low back pain: Secondary | ICD-10-CM | POA: Diagnosis not present

## 2018-05-07 DIAGNOSIS — M5033 Other cervical disc degeneration, cervicothoracic region: Secondary | ICD-10-CM | POA: Diagnosis not present

## 2018-05-07 DIAGNOSIS — M9901 Segmental and somatic dysfunction of cervical region: Secondary | ICD-10-CM | POA: Diagnosis not present

## 2018-05-07 DIAGNOSIS — M9903 Segmental and somatic dysfunction of lumbar region: Secondary | ICD-10-CM | POA: Diagnosis not present

## 2018-05-09 DIAGNOSIS — M9903 Segmental and somatic dysfunction of lumbar region: Secondary | ICD-10-CM | POA: Diagnosis not present

## 2018-05-09 DIAGNOSIS — M5033 Other cervical disc degeneration, cervicothoracic region: Secondary | ICD-10-CM | POA: Diagnosis not present

## 2018-05-09 DIAGNOSIS — M545 Low back pain: Secondary | ICD-10-CM | POA: Diagnosis not present

## 2018-05-09 DIAGNOSIS — M9901 Segmental and somatic dysfunction of cervical region: Secondary | ICD-10-CM | POA: Diagnosis not present

## 2018-05-21 DIAGNOSIS — M9903 Segmental and somatic dysfunction of lumbar region: Secondary | ICD-10-CM | POA: Diagnosis not present

## 2018-05-21 DIAGNOSIS — M545 Low back pain: Secondary | ICD-10-CM | POA: Diagnosis not present

## 2018-05-21 DIAGNOSIS — M5033 Other cervical disc degeneration, cervicothoracic region: Secondary | ICD-10-CM | POA: Diagnosis not present

## 2018-05-21 DIAGNOSIS — M9901 Segmental and somatic dysfunction of cervical region: Secondary | ICD-10-CM | POA: Diagnosis not present

## 2018-05-27 DIAGNOSIS — M205X1 Other deformities of toe(s) (acquired), right foot: Secondary | ICD-10-CM | POA: Diagnosis not present

## 2018-05-27 DIAGNOSIS — M79671 Pain in right foot: Secondary | ICD-10-CM | POA: Diagnosis not present

## 2018-05-27 DIAGNOSIS — M109 Gout, unspecified: Secondary | ICD-10-CM | POA: Diagnosis not present

## 2018-05-27 DIAGNOSIS — M65871 Other synovitis and tenosynovitis, right ankle and foot: Secondary | ICD-10-CM | POA: Diagnosis not present

## 2018-06-03 DIAGNOSIS — M65871 Other synovitis and tenosynovitis, right ankle and foot: Secondary | ICD-10-CM | POA: Diagnosis not present

## 2018-07-01 DIAGNOSIS — M9901 Segmental and somatic dysfunction of cervical region: Secondary | ICD-10-CM | POA: Diagnosis not present

## 2018-07-01 DIAGNOSIS — M9903 Segmental and somatic dysfunction of lumbar region: Secondary | ICD-10-CM | POA: Diagnosis not present

## 2018-07-01 DIAGNOSIS — M545 Low back pain: Secondary | ICD-10-CM | POA: Diagnosis not present

## 2018-07-01 DIAGNOSIS — M5033 Other cervical disc degeneration, cervicothoracic region: Secondary | ICD-10-CM | POA: Diagnosis not present

## 2018-07-08 DIAGNOSIS — M9901 Segmental and somatic dysfunction of cervical region: Secondary | ICD-10-CM | POA: Diagnosis not present

## 2018-07-08 DIAGNOSIS — M9903 Segmental and somatic dysfunction of lumbar region: Secondary | ICD-10-CM | POA: Diagnosis not present

## 2018-07-08 DIAGNOSIS — M545 Low back pain: Secondary | ICD-10-CM | POA: Diagnosis not present

## 2018-07-08 DIAGNOSIS — M5033 Other cervical disc degeneration, cervicothoracic region: Secondary | ICD-10-CM | POA: Diagnosis not present

## 2018-08-10 DIAGNOSIS — F4323 Adjustment disorder with mixed anxiety and depressed mood: Secondary | ICD-10-CM | POA: Diagnosis not present

## 2018-08-24 DIAGNOSIS — F4323 Adjustment disorder with mixed anxiety and depressed mood: Secondary | ICD-10-CM | POA: Diagnosis not present

## 2018-10-14 ENCOUNTER — Ambulatory Visit: Payer: BLUE CROSS/BLUE SHIELD | Admitting: Family Medicine

## 2018-10-14 ENCOUNTER — Encounter: Payer: Self-pay | Admitting: Family Medicine

## 2018-10-14 VITALS — BP 120/80 | HR 79 | Temp 98.4°F | Resp 12 | Ht 72.0 in | Wt 235.4 lb

## 2018-10-14 DIAGNOSIS — R0981 Nasal congestion: Secondary | ICD-10-CM

## 2018-10-14 DIAGNOSIS — R059 Cough, unspecified: Secondary | ICD-10-CM

## 2018-10-14 DIAGNOSIS — R05 Cough: Secondary | ICD-10-CM | POA: Diagnosis not present

## 2018-10-14 DIAGNOSIS — J069 Acute upper respiratory infection, unspecified: Secondary | ICD-10-CM | POA: Diagnosis not present

## 2018-10-14 MED ORDER — BENZONATATE 100 MG PO CAPS
200.0000 mg | ORAL_CAPSULE | Freq: Two times a day (BID) | ORAL | 0 refills | Status: AC | PRN
Start: 2018-10-14 — End: 2018-10-24

## 2018-10-14 MED ORDER — PREDNISONE 20 MG PO TABS
40.0000 mg | ORAL_TABLET | Freq: Every day | ORAL | 0 refills | Status: AC
Start: 2018-10-14 — End: 2018-10-17

## 2018-10-14 MED ORDER — HYDROCODONE-HOMATROPINE 5-1.5 MG/5ML PO SYRP: 5.0000 mL | ORAL_SOLUTION | Freq: Two times a day (BID) | ORAL | 0 refills | Status: AC | PRN

## 2018-10-14 NOTE — Progress Notes (Signed)
ACUTE VISIT  HPI:  Chief Complaint  Patient presents with  . Cough    nonproductive, sx started 1 week ago  . Sinus Pressure  . Chest congestion    Mr.Isaac [name] is a 43 y.o.male here today complaining of 7 days of above respiratory symptoms. Having coughing spells worse at night when in bed that interfere with sleep. No alleviating factors identified. Deneis dyspnea,wheezing,or chest pain. He has not noted fever,chills,or bodyaches. Nasal congestion and rhinorrhea, improved. But post nasal drainage is persistent.   URI   This is a new problem. The problem has been gradually improving. There has been no fever. Associated symptoms include congestion, coughing and rhinorrhea. Pertinent negatives include no abdominal pain, diarrhea, ear pain, headaches, nausea, rash, sinus pain, sore throat, swollen glands, vomiting or wheezing. He has tried antihistamine and acetaminophen for the symptoms. The treatment provided mild relief.   No Hx of recent travel. No sick contact. No known insect bite.  Hx of allergies: Seasonal.  OTC medications for this problem: Mucinex and Dayquil.  Review of Systems  Constitutional: Positive for fatigue. Negative for activity change, appetite change, chills and fever.  HENT: Positive for congestion, postnasal drip, rhinorrhea and sinus pressure. Negative for ear pain, mouth sores, sinus pain, sore throat, trouble swallowing and voice change.   Eyes: Negative for discharge and redness.  Respiratory: Positive for cough. Negative for chest tightness, shortness of breath and wheezing.   Gastrointestinal: Negative for abdominal pain, diarrhea, nausea and vomiting.  Musculoskeletal: Negative for gait problem and myalgias.  Skin: Negative for rash.  Allergic/Immunologic: Positive for environmental allergies.  Neurological: Negative for weakness and headaches.  Psychiatric/Behavioral: Positive for sleep disturbance. Negative for confusion.     Current Outpatient Medications on File Prior to Visit  Medication Sig Dispense Refill  . azithromycin (ZITHROMAX Z-PAK) 250 MG tablet As directed 6 each 0  . cyclobenzaprine (FLEXERIL) 10 MG tablet Take 1 tablet (10 mg total) by mouth at bedtime as needed for muscle spasms. 30 tablet 0  . meloxicam (MOBIC) 15 MG tablet Take one tablet a day for 7 days, then take as needed. Take with food 40 tablet 1  . Multiple Vitamin (MULTIVITAMIN) tablet Take 1 tablet by mouth.    . naproxen sodium (ANAPROX) 220 MG tablet Take 220 mg by mouth.     No current facility-administered medications on file prior to visit.      Past Medical History:  Diagnosis Date  . Chronic dryness of both eyes    sees Dr. Lorin Picket   . Kidney stones    sees Dr. Magdalen Spatz    Allergies  Allergen Reactions  . Codeine Hives    Social History   Socioeconomic History  . Marital status: Married    Spouse name: Not on file  . Number of children: Not on file  . Years of education: Not on file  . Highest education level: Not on file  Occupational History  . Not on file  Social Needs  . Financial resource strain: Not on file  . Food insecurity:    Worry: Not on file    Inability: Not on file  . Transportation needs:    Medical: Not on file    Non-medical: Not on file  Tobacco Use  . Smoking status: Current Some Day Smoker    Types: Cigars  . Smokeless tobacco: Never Used  . Tobacco comment: occ  Substance and Sexual Activity  . Alcohol use: Yes  Alcohol/week: 0.0 standard drinks    Comment: occ beer  . Drug use: No  . Sexual activity: Not on file  Lifestyle  . Physical activity:    Days per week: Not on file    Minutes per session: Not on file  . Stress: Not on file  Relationships  . Social connections:    Talks on phone: Not on file    Gets together: Not on file    Attends religious service: Not on file    Active member of club or organization: Not on file    Attends meetings of clubs or  organizations: Not on file    Relationship status: Not on file  Other Topics Concern  . Not on file  Social History Narrative  . Not on file    Vitals:   10/14/18 1447  BP: 120/80  Pulse: 79  Resp: 12  Temp: 98.4 F (36.9 C)  SpO2: 97%   Body mass index is 31.92 kg/m.   Physical Exam  Nursing note and vitals reviewed. Constitutional: He is oriented to person, place, and time. He appears well-developed. He does not appear ill. No distress.  HENT:  Head: Normocephalic and atraumatic.  Right Ear: External ear and ear canal normal. Tympanic membrane is not erythematous and not bulging. A middle ear effusion (mild) is present.  Left Ear: External ear and ear canal normal. Tympanic membrane is not erythematous and not bulging. A middle ear effusion (mild) is present.  Nose: Rhinorrhea present. Right sinus exhibits no maxillary sinus tenderness and no frontal sinus tenderness. Left sinus exhibits no maxillary sinus tenderness and no frontal sinus tenderness.  Mouth/Throat: Oropharynx is clear and moist and mucous membranes are normal.  Hypertrophic turbinates. Postnasal drainage.  Eyes: Conjunctivae and EOM are normal.  Cardiovascular: Normal rate and regular rhythm.  No murmur heard. Respiratory: Effort normal and breath sounds normal. No stridor. No respiratory distress.  Lymphadenopathy:       Head (right side): No submandibular adenopathy present.       Head (left side): No submandibular adenopathy present.    He has no cervical adenopathy.  Neurological: He is alert and oriented to person, place, and time. He has normal strength.  Skin: Skin is warm. No rash noted. No erythema.  Psychiatric: He has a normal mood and affect.  Well groomed, good eye contact.    ASSESSMENT AND PLAN:  Mr. [name] was seen today for cough, sinus pressure and chest congestion.  Diagnoses and all orders for this visit:  URI, acute Symptoms suggests a viral etiology, symptomatic treatment  recommended. I do not think abx is needed at this time. Instructed to monitor for signs of complications, including new onset of fever among some, clearly instructed about warning signs. . F/U as needed.  Cough I also explained that cough and nasal congestion can last a few days and sometimes weeks Lung auscultation negative,I do not think CXR is needed. Increase fluid intake.  -     benzonatate (TESSALON) 100 MG capsule; Take 2 capsules (200 mg total) by mouth 2 (two) times daily as needed for up to 10 days. -     HYDROcodone-homatropine (HYCODAN) 5-1.5 MG/5ML syrup; Take 5 mLs by mouth every 12 (twelve) hours as needed for up to 10 days.  Nasal sinus congestion Could also be aggravated by allergies. Nasal irrigations with saline as needed. Short course of Prednisone may help,side effects discussed.  -     predniSONE (DELTASONE) 20 MG tablet; Take 2  tablets (40 mg total) by mouth daily with breakfast for 3 days.    Return if symptoms worsen or fail to improve.     Festus Pursel G. [place], MD  [facility]. [facility].

## 2018-10-14 NOTE — Patient Instructions (Addendum)
  Mr.Brand A Amason I have seen you today for an acute visit.  A few things to remember from today's visit:   Cough - Plan: benzonatate (TESSALON) 100 MG capsule, HYDROcodone-homatropine (HYCODAN) 5-1.5 MG/5ML syrup  Nasal sinus congestion - Plan: predniSONE (DELTASONE) 20 MG tablet  -Symptoms reported today are related to her recent respiratory infection, most likely viral, so antibiotic is not necessary at this time. Monitor for fever. Nasal irrigation with saline as needed will help to prevent sinus infection and may help with the postnasal drainage.   Take prednisone with breakfast.   In general please monitor for signs of worsening symptoms and seek immediate medical attention if any concerning.  If symptoms are not resolved in 2 weeks you should schedule a follow up appointment with your doctor, before if needed.  I hope you get better soon!

## 2018-12-11 ENCOUNTER — Other Ambulatory Visit: Payer: Self-pay

## 2018-12-11 ENCOUNTER — Encounter: Payer: Self-pay | Admitting: Family Medicine

## 2018-12-11 ENCOUNTER — Ambulatory Visit (INDEPENDENT_AMBULATORY_CARE_PROVIDER_SITE_OTHER): Payer: BLUE CROSS/BLUE SHIELD | Admitting: Family Medicine

## 2018-12-11 DIAGNOSIS — R350 Frequency of micturition: Secondary | ICD-10-CM

## 2018-12-11 MED ORDER — TAMSULOSIN HCL 0.4 MG PO CAPS: 0.4000 mg | ORAL_CAPSULE | Freq: Every day | ORAL | 3 refills | Status: DC

## 2018-12-11 NOTE — Progress Notes (Signed)
   Subjective:    Patient ID: Isaac [name], male    DOB: February 09, 1976, 43 y.o.   MRN: [id number]  HPI Virtual Visit via Telephone Note  I connected with the patient on 12/11/18 at  8:00 AM EDT by telephone and verified that I am speaking with the correct person using two identifiers. We attempted to use [employer] but we had technical difficulties with the audio and video.    I discussed the limitations, risks, security and privacy concerns of performing an evaluation and management service by telephone and the availability of in person appointments. I also discussed with the patient that there may be a patient responsible charge related to this service. The patient expressed understanding and agreed to proceed.  Location patient: home Location provider: work or home office Participants present for the call: patient, provider Patient did not have a visit in the prior 7 days to address this/these issue(s).   History of Present Illness: For the past month he has had urinary urgency and frequency, especially during the night. There is no burning or pain. No fever. He has no problems during the day.    Observations/Objective: Patient sounds cheerful and well on the phone. I do not appreciate any SOB. Speech and thought processing are grossly intact. Patient reported vitals:  Assessment and Plan: He may have some BPH issues. Try Flomax 0.4 mg daily. Recheck prn.  Gershon Crane, MD   Follow Up Instructions:     [phone] 5-10 [phone] 11-20 [phone] 21-30 I did not refer this patient for an OV in the next 24 hours for this/these issue(s).  I discussed the assessment and treatment plan with the patient. The patient was provided an opportunity to ask questions and all were answered. The patient agreed with the plan and demonstrated an understanding of the instructions.   The patient was advised to call back or seek an in-person evaluation if the symptoms worsen or if the condition fails to improve  as anticipated.  I provided 13 minutes of non-face-to-face time during this encounter.   Gershon Crane, MD    Review of Systems     Objective:   Physical Exam        Assessment & Plan:

## 2019-03-26 ENCOUNTER — Encounter: Payer: Self-pay | Admitting: Family Medicine

## 2019-03-27 MED ORDER — TAMSULOSIN HCL 0.4 MG PO CAPS: 0.4000 mg | ORAL_CAPSULE | Freq: Every day | ORAL | 3 refills | Status: DC

## 2019-03-27 NOTE — Telephone Encounter (Signed)
In all honesty I doubt if the frequency of him having sex has much to do with it. I would suggest he double up and take 2 capsules daily for a week or so and see if that helps

## 2019-04-18 ENCOUNTER — Encounter: Payer: Self-pay | Admitting: Family Medicine

## 2019-04-18 NOTE — Telephone Encounter (Signed)
Make him an in person OV so I can check his prostate and get a urine sample

## 2019-04-22 ENCOUNTER — Encounter: Payer: Self-pay | Admitting: Family Medicine

## 2019-04-22 ENCOUNTER — Other Ambulatory Visit: Payer: Self-pay

## 2019-04-22 ENCOUNTER — Ambulatory Visit: Payer: BC Managed Care – PPO | Admitting: Family Medicine

## 2019-04-22 VITALS — BP 120/64 | HR 81 | Temp 97.2°F | Wt 230.8 lb

## 2019-04-22 DIAGNOSIS — R350 Frequency of micturition: Secondary | ICD-10-CM | POA: Diagnosis not present

## 2019-04-22 DIAGNOSIS — N3281 Overactive bladder: Secondary | ICD-10-CM | POA: Diagnosis not present

## 2019-04-22 LAB — POCT URINALYSIS DIPSTICK
Bilirubin, UA: NEGATIVE
Blood, UA: NEGATIVE
Glucose, UA: NEGATIVE
Ketones, UA: NEGATIVE
Leukocytes, UA: NEGATIVE
Nitrite, UA: NEGATIVE
Protein, UA: NEGATIVE
Spec Grav, UA: 1.01 (ref 1.010–1.025)
Urobilinogen, UA: 0.2 E.U./dL
pH, UA: 6 (ref 5.0–8.0)

## 2019-04-22 MED ORDER — SOLIFENACIN SUCCINATE 5 MG PO TABS: 5.0000 mg | ORAL_TABLET | Freq: Every day | ORAL | 2 refills | Status: DC

## 2019-04-22 NOTE — Progress Notes (Signed)
   Subjective:    Patient ID: Isaac [name], male    DOB: July 08, 1976, 43 y.o.   MRN: [id number]  HPI Here to follow up on urinary urgency. This bothers him mostly at night. He has to urinate 2-3 times a night and this disrupts his sleep. There is no discomfort but he does have an urgency to go. Flomax has not helped much, either at the 0.4 mg or the 0.8 mg dose. His UA today is clear.    Review of Systems  Constitutional: Negative.   Respiratory: Negative.   Cardiovascular: Negative.   Gastrointestinal: Negative.   Genitourinary: Positive for frequency and urgency. Negative for discharge, dysuria, flank pain and hematuria.       Objective:   Physical Exam Constitutional:      Appearance: Normal appearance.  Cardiovascular:     Rate and Rhythm: Normal rate and regular rhythm.     Pulses: Normal pulses.     Heart sounds: Normal heart sounds.  Pulmonary:     Effort: Pulmonary effort is normal.     Breath sounds: Normal breath sounds.  Abdominal:     General: Abdomen is flat. Bowel sounds are normal. There is no distension.     Palpations: Abdomen is soft. There is no mass.     Tenderness: There is no abdominal tenderness. There is no guarding or rebound.     Hernia: No hernia is present.  Genitourinary:    Penis: Normal.      Scrotum/Testes: Normal.     Prostate: Normal.     Comments: Prostate is not swollen or tender  Neurological:     Mental Status: He is alert.           Assessment & Plan:  Rather than BPH, this seems to be OAB. We will stop the Flomax and try Vesicare 5mg  daily.  Alysia Penna, MD

## 2019-05-01 ENCOUNTER — Encounter: Payer: Self-pay | Admitting: Family Medicine

## 2019-05-01 NOTE — Telephone Encounter (Signed)
Tell him to take 2 tabs of the Solfenacin (total of 10 mg) at bedtime and give this a week or two

## 2019-05-07 ENCOUNTER — Encounter: Payer: Self-pay | Admitting: Family Medicine

## 2019-05-07 DIAGNOSIS — R350 Frequency of micturition: Secondary | ICD-10-CM

## 2019-05-07 NOTE — Telephone Encounter (Signed)
It doesn't sound like a stone, but I did refer him to Urology to figure this out

## 2019-05-09 NOTE — Telephone Encounter (Signed)
He can keep taking 2 tabs a day of the Vesicare until he sees Urology

## 2019-05-12 ENCOUNTER — Encounter: Payer: Self-pay | Admitting: Family Medicine

## 2019-05-12 MED ORDER — SOLIFENACIN SUCCINATE 10 MG PO TABS: 10.0000 mg | ORAL_TABLET | Freq: Every day | ORAL | 3 refills | Status: DC

## 2019-05-12 NOTE — Telephone Encounter (Signed)
Change the Solifenicen to 10 mg daily. Call in #[phone] with 3 rf

## 2019-05-12 NOTE — Telephone Encounter (Signed)
Patient checking on the status of message below, patient would like new Rx mentioned below, please send to   [id number] - [place], [address] [phone] (Phone) [phone] (Fax)

## 2019-07-22 ENCOUNTER — Encounter: Payer: Self-pay | Admitting: Family Medicine

## 2019-07-30 ENCOUNTER — Other Ambulatory Visit: Payer: Self-pay

## 2019-07-30 ENCOUNTER — Encounter: Payer: Self-pay | Admitting: Family Medicine

## 2019-07-30 ENCOUNTER — Ambulatory Visit: Payer: BC Managed Care – PPO | Admitting: Family Medicine

## 2019-07-30 VITALS — BP 110/60 | HR 66 | Temp 98.2°F | Ht 72.0 in | Wt 234.0 lb

## 2019-07-30 DIAGNOSIS — Z23 Encounter for immunization: Secondary | ICD-10-CM | POA: Diagnosis not present

## 2019-07-30 DIAGNOSIS — R251 Tremor, unspecified: Secondary | ICD-10-CM

## 2019-07-30 DIAGNOSIS — N3281 Overactive bladder: Secondary | ICD-10-CM

## 2019-07-30 NOTE — Progress Notes (Signed)
   Subjective:    Patient ID: Isaac [name], male    DOB: 1976/05/17, 42 y.o.   MRN: [id number]  HPI    Review of Systems     Objective:   Physical Exam        Assessment & Plan:

## 2019-07-30 NOTE — Progress Notes (Signed)
   Subjective:    Patient ID: Isaac [name], male    DOB: [birth year], 43 y.o.   MRN: [id number]  HPI Here for 2 issues. First his OAB has been a problem, and the Vesicare only partially treats this. This has also caused him a lot of side effects like dry mouth. Second for the past year he has had an intermittent tremor in the right hand and arm. This is worse when he drinks caffeine or when he is stressed. This is worst at rest and it goes away when he is using the hand for something. He is concerned because there is a distant family hx of Parkinsons in his family. Today is a good day because he has not had any caffeine.    Review of Systems  Constitutional: Negative.   Respiratory: Negative.   Cardiovascular: Negative.   Genitourinary: Positive for frequency and urgency. Negative for dysuria.  Neurological: Positive for tremors.       Objective:   Physical Exam Constitutional:      Appearance: Normal appearance.  Cardiovascular:     Rate and Rhythm: Normal rate and regular rhythm.     Pulses: Normal pulses.     Heart sounds: Normal heart sounds.  Pulmonary:     Effort: Pulmonary effort is normal.     Breath sounds: Normal breath sounds.  Neurological:     General: No focal deficit present.     Mental Status: He is alert and oriented to person, place, and time.     Comments: There is no evidence of tremor on exam today, either resting or intentional            Assessment & Plan:  For the OAB, we will refer him to Urology. For the tremor, I offered to refer him to Neurology but he wants to wait. He has decided to give up caffeine completely and he will see how the tremors react to that.  Alysia Penna, MD

## 2019-08-07 ENCOUNTER — Ambulatory Visit: Payer: BC Managed Care – PPO

## 2019-08-13 DIAGNOSIS — R351 Nocturia: Secondary | ICD-10-CM | POA: Diagnosis not present

## 2019-08-26 DIAGNOSIS — Z20828 Contact with and (suspected) exposure to other viral communicable diseases: Secondary | ICD-10-CM | POA: Diagnosis not present

## 2019-10-20 ENCOUNTER — Encounter: Payer: Self-pay | Admitting: Family Medicine

## 2019-10-21 NOTE — Telephone Encounter (Signed)
Yes I would be happy to see him  

## 2020-01-23 ENCOUNTER — Other Ambulatory Visit: Payer: Self-pay

## 2020-01-23 ENCOUNTER — Encounter: Payer: Self-pay | Admitting: Family Medicine

## 2020-01-23 ENCOUNTER — Ambulatory Visit (INDEPENDENT_AMBULATORY_CARE_PROVIDER_SITE_OTHER): Payer: BC Managed Care – PPO | Admitting: Family Medicine

## 2020-01-23 VITALS — BP 110/64 | HR 70 | Temp 97.9°F | Wt 230.6 lb

## 2020-01-23 DIAGNOSIS — W57XXXA Bitten or stung by nonvenomous insect and other nonvenomous arthropods, initial encounter: Secondary | ICD-10-CM | POA: Diagnosis not present

## 2020-01-23 DIAGNOSIS — S70361A Insect bite (nonvenomous), right thigh, initial encounter: Secondary | ICD-10-CM

## 2020-01-23 NOTE — Progress Notes (Signed)
   Subjective:    Patient ID: Isaac [name], male    DOB: Jun 19, 1976, 44 y.o.   MRN: [id number]  HPI Here to check the site of a tick bite on the right thigh. He noticed a tick embedded here 6 days ago and he pulled it out with tweezers. He has felt fine since then, but he was concerned the area is still red. It itches but is not painful.    Review of Systems  Constitutional: Negative.   Respiratory: Negative.   Cardiovascular: Negative.   Skin: Positive for wound. Negative for rash.       Objective:   Physical Exam Constitutional:      Appearance: Normal appearance.  Cardiovascular:     Rate and Rhythm: Normal rate and regular rhythm.     Pulses: Normal pulses.     Heart sounds: Normal heart sounds.  Pulmonary:     Effort: Pulmonary effort is normal.     Breath sounds: Normal breath sounds.  Skin:    Comments: The medial right thigh has a small bite mark which is mildly indurated and surrounded by erythema. No warmth or tenderness   Neurological:     Mental Status: He is alert.           Assessment & Plan:  Local irritation from a tick bite. He was reassured this is benign and will resolve in the next week or two. Recheck prn.  Gershon Crane, MD

## 2020-02-01 ENCOUNTER — Encounter: Payer: Self-pay | Admitting: Family Medicine

## 2020-02-03 MED ORDER — KETOCONAZOLE 2 % EX CREA: 1.0000 "application " | TOPICAL_CREAM | Freq: Two times a day (BID) | CUTANEOUS | 2 refills | Status: DC | PRN

## 2020-02-03 NOTE — Telephone Encounter (Signed)
Call in Ketoconazole 2% cream to apply BID as needed, 30 grams with 2 rf

## 2020-02-07 DIAGNOSIS — F4323 Adjustment disorder with mixed anxiety and depressed mood: Secondary | ICD-10-CM | POA: Diagnosis not present

## 2020-07-08 ENCOUNTER — Encounter: Payer: Self-pay | Admitting: Family Medicine

## 2020-07-08 ENCOUNTER — Ambulatory Visit: Payer: BC Managed Care – PPO | Admitting: Family Medicine

## 2020-07-08 ENCOUNTER — Other Ambulatory Visit: Payer: Self-pay

## 2020-07-08 VITALS — BP 123/73 | HR 76 | Ht 72.0 in | Wt 232.0 lb

## 2020-07-08 DIAGNOSIS — K219 Gastro-esophageal reflux disease without esophagitis: Secondary | ICD-10-CM | POA: Insufficient documentation

## 2020-07-08 DIAGNOSIS — Z23 Encounter for immunization: Secondary | ICD-10-CM

## 2020-07-08 MED ORDER — OMEPRAZOLE 20 MG PO CPDR: 20.0000 mg | DELAYED_RELEASE_CAPSULE | Freq: Every day | ORAL | 3 refills | Status: DC

## 2020-07-08 NOTE — Progress Notes (Signed)
   Subjective:    Patient ID: Isaac [name], male    DOB: 03-29-1976, 44 y.o.   MRN: [id number]  HPI Here for frequent heartburn. This started 2 months ago and he has it almost daily. He has burning pains in the chest and he gets quick relief with TUMS. No SOB or nausea. He uses very little caffeine or alcohol. He has tried Pepcid and Prilosec with good results. He asks if it is safe to take these medications routinely.    Review of Systems  Constitutional: Negative.   Respiratory: Negative.   Cardiovascular: Negative.   Gastrointestinal: Negative.        Objective:   Physical Exam Constitutional:      Appearance: Normal appearance. He is not ill-appearing.  Cardiovascular:     Rate and Rhythm: Normal rate and regular rhythm.     Pulses: Normal pulses.     Heart sounds: Normal heart sounds.  Pulmonary:     Effort: Pulmonary effort is normal.     Breath sounds: Normal breath sounds.  Abdominal:     General: Abdomen is flat. Bowel sounds are normal. There is no distension.     Palpations: Abdomen is soft. There is no mass.     Tenderness: There is no abdominal tenderness. There is no guarding or rebound.     Hernia: No hernia is present.  Neurological:     Mental Status: He is alert.           Assessment & Plan:  GERD. He will take Omeprazole 20 mg daily. Recheck as needed.  Gershon Crane, MD

## 2020-07-09 ENCOUNTER — Encounter: Payer: Self-pay | Admitting: Family Medicine

## 2020-07-09 NOTE — Telephone Encounter (Signed)
I doubt he had any serious sort of heart rhythm, but obviously if this continues he should see [place] for a work up

## 2020-08-12 DIAGNOSIS — F4323 Adjustment disorder with mixed anxiety and depressed mood: Secondary | ICD-10-CM | POA: Diagnosis not present

## 2020-08-17 ENCOUNTER — Ambulatory Visit: Payer: Self-pay

## 2020-08-17 ENCOUNTER — Ambulatory Visit: Payer: BC Managed Care – PPO | Admitting: Orthopaedic Surgery

## 2020-08-17 ENCOUNTER — Encounter: Payer: Self-pay | Admitting: Orthopaedic Surgery

## 2020-08-17 DIAGNOSIS — M25511 Pain in right shoulder: Secondary | ICD-10-CM | POA: Diagnosis not present

## 2020-08-17 DIAGNOSIS — M549 Dorsalgia, unspecified: Secondary | ICD-10-CM | POA: Diagnosis not present

## 2020-08-17 DIAGNOSIS — M542 Cervicalgia: Secondary | ICD-10-CM | POA: Diagnosis not present

## 2020-08-17 MED ORDER — PREDNISONE 10 MG (21) PO TBPK: ORAL_TABLET | ORAL | 0 refills | Status: DC

## 2020-08-17 MED ORDER — METHOCARBAMOL 500 MG PO TABS: 500.0000 mg | ORAL_TABLET | Freq: Three times a day (TID) | ORAL | 0 refills | Status: DC | PRN

## 2020-08-17 NOTE — Progress Notes (Signed)
Office Visit Note   Patient: Isaac [name]           Date of Birth: Feb 01, 1976           MRN: [id number] Visit Date: 08/17/2020              Requested by: Nelwyn Salisbury, MD [address] [place],  [place] [address] PCP: Nelwyn Salisbury, MD   Assessment & Plan: Visit Diagnoses:  1. Neck pain   2. Mid back pain   3. Acute pain of right shoulder     Plan: Impression is probable cervical spine herniated disc.  I have called in a steroid and muscle relaxer as well as ordered an MRI of the cervical spine.  He will follow up with [place] once that has been completed.  Call with concerns or questions in the meantime.  Follow-Up Instructions: Return for after MRI.   Orders:  Orders Placed This Encounter  Procedures  . XR Cervical Spine 2 or 3 views  . XR Thoracic Spine 2 View  . XR Shoulder Right  . MR Cervical Spine w/o contrast   Meds ordered this encounter  Medications  . methocarbamol (ROBAXIN) 500 MG tablet    Sig: Take 1 tablet (500 mg total) by mouth 3 (three) times daily as needed.    Dispense:  30 tablet    Refill:  0  . predniSONE (STERAPRED UNI-PAK 21 TAB) 10 MG (21) TBPK tablet    Sig: Take as directed    Dispense:  21 tablet    Refill:  0      Procedures: No procedures performed   Clinical Data: No additional findings.   Subjective: Chief Complaint  Patient presents with  . Neck - Pain  . Middle Back - Pain  . Right Shoulder - Pain    HPI patient is a pleasant 44 year old gentleman who comes in today with right-sided neck pain radiating into the right parascapular region and down the right arm and into the fingers.  He describes this as a constant ache with associated paresthesias down the right arm and into the hand and primarily into the ring and small fingers.  He has increased pain with rotation of the neck to the right.  He has noticed increased weakness to the right upper extremity as well.  He has a history of previous neck pathology which he  has had trigger point injections as well as epidural steroid injections.  Review of Systems as detailed in HPI.  All others reviewed and are negative.   Objective: Vital Signs: There were no vitals taken for this visit.  Physical Exam well-developed well-nourished gentleman in no acute distress  Ortho Exam cervical spine exam shows no spinous tenderness.  Moderate tenderness along the right parascapular region.  Specialty Comments:  No specialty comments available.  Imaging: XR Thoracic Spine 2 View  Result Date: 08/17/2020 No acute or structural abnormalities  XR Cervical Spine 2 or 3 views  Result Date: 08/17/2020 Straightening of the cervical spine with moderate degenerative changes C4-5 and C5-6  XR Shoulder Right  Result Date: 08/17/2020 No acute or structural abnormalities    PMFS History: Patient Active Problem List   Diagnosis Date Noted  . Gastroesophageal reflux disease 07/08/2020  . OAB (overactive bladder) 07/30/2019  . Tremor 07/30/2019  . Gout 04/30/2017  . Chronically dry eyes 01/11/2016  . Back pain 01/11/2016   Past Medical History:  Diagnosis Date  . Chronic dryness of both eyes  sees Dr. Lorin Picket   . Kidney stones    sees Dr. Magdalen Spatz     Family History  Problem Relation Age of Onset  . Breast cancer Mother   . Cancer Mother   . Stroke Mother   . Cancer Maternal Grandfather   . Cancer Paternal Grandfather     Past Surgical History:  Procedure Laterality Date  . epidural steroid shot     upper and lower spine   . kidney stones     10 years ago  . KNEE ARTHROSCOPY WITH MENISCAL REPAIR Left   . SPINE SURGERY     injections   Social History   Occupational History  . Not on file  Tobacco Use  . Smoking status: Current Some Day Smoker    Types: Cigars  . Smokeless tobacco: Never Used  . Tobacco comment: occ  Substance and Sexual Activity  . Alcohol use: Yes    Alcohol/week: 0.0 standard drinks    Comment: occ beer  . Drug  use: No  . Sexual activity: Not on file

## 2020-08-18 ENCOUNTER — Ambulatory Visit: Payer: BC Managed Care – PPO | Admitting: Family Medicine

## 2020-08-18 ENCOUNTER — Encounter: Payer: Self-pay | Admitting: Orthopaedic Surgery

## 2020-08-18 NOTE — Telephone Encounter (Signed)
Do you know how to release those results?

## 2020-08-24 ENCOUNTER — Ambulatory Visit: Payer: BC Managed Care – PPO | Admitting: Family Medicine

## 2020-08-25 ENCOUNTER — Encounter: Payer: Self-pay | Admitting: Orthopaedic Surgery

## 2020-08-26 NOTE — Telephone Encounter (Signed)
Can you let me know what all he is taking for pain?  Also, can we make mri urgent?

## 2020-08-26 NOTE — Telephone Encounter (Signed)
He can

## 2020-09-03 ENCOUNTER — Telehealth: Payer: Self-pay

## 2020-09-03 NOTE — Telephone Encounter (Signed)
Patient called stating that he is still having right shoulder pain and that the muscle relaxer and aleve is not helping.  Would like to know if there is something else that he can do?  Currently waiting to have MRI done.  CB# [phone].  Please advise.  Thank you.

## 2020-09-03 NOTE — Telephone Encounter (Signed)
Is there anything else you can send into pharm?

## 2020-09-04 ENCOUNTER — Other Ambulatory Visit: Payer: Self-pay | Admitting: Physician Assistant

## 2020-09-04 MED ORDER — TRAMADOL HCL 50 MG PO TABS: 50.0000 mg | ORAL_TABLET | Freq: Three times a day (TID) | ORAL | 0 refills | Status: DC | PRN

## 2020-09-04 NOTE — Telephone Encounter (Signed)
Tramadol sent in 

## 2020-09-09 ENCOUNTER — Other Ambulatory Visit: Payer: Self-pay

## 2020-09-09 ENCOUNTER — Telehealth: Payer: Self-pay | Admitting: Orthopaedic Surgery

## 2020-09-09 ENCOUNTER — Ambulatory Visit
Admission: RE | Admit: 2020-09-09 | Discharge: 2020-09-09 | Disposition: A | Discharge status: Home / Self Care | Payer: BC Managed Care – PPO | Source: Ambulatory Visit | Attending: Orthopaedic Surgery | Admitting: Orthopaedic Surgery

## 2020-09-09 DIAGNOSIS — M542 Cervicalgia: Secondary | ICD-10-CM

## 2020-09-09 DIAGNOSIS — M4802 Spinal stenosis, cervical region: Secondary | ICD-10-CM | POA: Diagnosis not present

## 2020-09-09 NOTE — Telephone Encounter (Signed)
Patient called asked if Dr Roda Shutters would call him with the results of his MRI? Patient said he do not want to wait for the appointment because of the pain he is in. The number to contact patient is  [phone]

## 2020-09-09 NOTE — Telephone Encounter (Signed)
Spoke to patient.  Please set up a cervical ESI with [facility] depending on who can see him first.  Sounds like symptoms are most consistent with the C6-7 level.

## 2020-09-09 NOTE — Progress Notes (Signed)
Needs appt.  thx

## 2020-09-09 NOTE — Telephone Encounter (Signed)
Please call patient if possible

## 2020-09-10 ENCOUNTER — Other Ambulatory Visit: Payer: Self-pay

## 2020-09-10 DIAGNOSIS — M542 Cervicalgia: Secondary | ICD-10-CM

## 2020-09-10 NOTE — Telephone Encounter (Signed)
See message below. Order put in for both places. Whoever can see patient first would be great. Thanks girls.

## 2020-09-14 ENCOUNTER — Other Ambulatory Visit: Payer: Self-pay | Admitting: Physician Assistant

## 2020-09-14 ENCOUNTER — Encounter: Payer: Self-pay | Admitting: Orthopaedic Surgery

## 2020-09-14 DIAGNOSIS — Z20822 Contact with and (suspected) exposure to covid-19: Secondary | ICD-10-CM | POA: Diagnosis not present

## 2020-09-14 MED ORDER — TRAMADOL HCL 50 MG PO TABS
50.0000 mg | ORAL_TABLET | Freq: Three times a day (TID) | ORAL | 0 refills | Status: DC | PRN
Start: 2020-09-14 — End: 2021-08-19

## 2020-09-14 NOTE — Telephone Encounter (Signed)
Just sent in

## 2020-09-15 ENCOUNTER — Ambulatory Visit: Payer: BC Managed Care – PPO | Admitting: Orthopaedic Surgery

## 2020-09-17 ENCOUNTER — Other Ambulatory Visit: Payer: BC Managed Care – PPO

## 2020-09-23 ENCOUNTER — Ambulatory Visit
Admission: RE | Admit: 2020-09-23 | Discharge: 2020-09-23 | Disposition: A | Discharge status: Home / Self Care | Payer: BC Managed Care – PPO | Source: Ambulatory Visit | Attending: Orthopaedic Surgery | Admitting: Orthopaedic Surgery

## 2020-09-23 ENCOUNTER — Other Ambulatory Visit: Payer: Self-pay

## 2020-09-23 DIAGNOSIS — M542 Cervicalgia: Secondary | ICD-10-CM

## 2020-09-23 DIAGNOSIS — M47812 Spondylosis without myelopathy or radiculopathy, cervical region: Secondary | ICD-10-CM | POA: Diagnosis not present

## 2020-09-23 MED ORDER — IOPAMIDOL (ISOVUE-M 300) INJECTION 61%
1.0000 mL | Freq: Once | INTRAMUSCULAR | Status: AC | PRN
  Administered 2020-09-23: 1 mL via EPIDURAL

## 2020-09-23 MED ORDER — TRIAMCINOLONE ACETONIDE 40 MG/ML IJ SUSP (RADIOLOGY)
60.0000 mg | Freq: Once | INTRAMUSCULAR | Status: AC
  Administered 2020-09-23: 60 mg via EPIDURAL

## 2020-09-23 NOTE — Discharge Instructions (Signed)

## 2020-09-28 ENCOUNTER — Encounter: Payer: Self-pay | Admitting: Orthopaedic Surgery

## 2020-09-28 ENCOUNTER — Ambulatory Visit (INDEPENDENT_AMBULATORY_CARE_PROVIDER_SITE_OTHER): Payer: BC Managed Care – PPO | Admitting: Orthopaedic Surgery

## 2020-09-28 ENCOUNTER — Other Ambulatory Visit: Payer: Self-pay

## 2020-09-28 DIAGNOSIS — M5412 Radiculopathy, cervical region: Secondary | ICD-10-CM | POA: Diagnosis not present

## 2020-09-28 NOTE — Progress Notes (Signed)
Office Visit Note   Patient: Isaac [name]           Date of Birth: 24-Dec-1975           MRN: [id number] Visit Date: 09/28/2020              Requested by: Nelwyn Salisbury, MD [address] [place],  [place] [address] PCP: Nelwyn Salisbury, MD   Assessment & Plan: Visit Diagnoses:  1. Radiculopathy of cervical spine     Plan: Impression is C6-7 right foraminal herniation and prominent C7 impingement as well as C4-5 moderate right foraminal stenosis.  The patient has had significant relief in symptoms after having the cortisone injection last week.  We will go ahead and start him in physical therapy and an internal referral has been made.  He will follow up with [place] as needed.  Follow-Up Instructions: Return if symptoms worsen or fail to improve.   Orders:  Orders Placed This Encounter  Procedures  . Ambulatory referral to Physical Therapy   No orders of the defined types were placed in this encounter.     Procedures: No procedures performed   Clinical Data: No additional findings.   Subjective: Chief Complaint  Patient presents with  . Neck - Pain    HPI patient comes in to review MRI results of the cervical spine.  He was seen by [place] about 6 weeks ago for right upper extremity radiculopathy and new onset weakness.  He was started on a steroid and muscle relaxer and MRI was ordered.  He notes mild relief in symptoms following the medications but continued to have a moderate amount of pain and weakness.  MRI from 09/09/2020 showed a C6-7 right foraminal herniation and prominent C7 impingement as well as C4-5 moderate right foraminal stenosis.  This was discussed over the phone and he was referred to [facility] imaging for steroid injection.  He notes that this was performed last week and has significantly helped.  He has minimal weakness to the right upper extremity and is taking an occasional tramadol or NSAIDs for pain.     Objective: Vital Signs: There were no  vitals taken for this visit.    Ortho Exam stable cervical spine exam  Specialty Comments:  No specialty comments available.  Imaging: No new imaging  PMFS History: Patient Active Problem List   Diagnosis Date Noted  . Gastroesophageal reflux disease 07/08/2020  . OAB (overactive bladder) 07/30/2019  . Tremor 07/30/2019  . Gout 04/30/2017  . Chronically dry eyes 01/11/2016  . Back pain 01/11/2016   Past Medical History:  Diagnosis Date  . Chronic dryness of both eyes    sees Dr. Lorin Picket   . Kidney stones    sees Dr. Magdalen Spatz     Family History  Problem Relation Age of Onset  . Breast cancer Mother   . Cancer Mother   . Stroke Mother   . Cancer Maternal Grandfather   . Cancer Paternal Grandfather     Past Surgical History:  Procedure Laterality Date  . epidural steroid shot     upper and lower spine   . kidney stones     10 years ago  . KNEE ARTHROSCOPY WITH MENISCAL REPAIR Left   . SPINE SURGERY     injections   Social History   Occupational History  . Not on file  Tobacco Use  . Smoking status: Current Some Day Smoker    Types: Cigars  . Smokeless tobacco:  Never Used  . Tobacco comment: occ  Substance and Sexual Activity  . Alcohol use: Yes    Alcohol/week: 0.0 standard drinks    Comment: occ beer  . Drug use: No  . Sexual activity: Not on file

## 2020-10-12 ENCOUNTER — Encounter: Payer: Self-pay | Admitting: Rehabilitative and Restorative Service Providers"

## 2020-10-12 ENCOUNTER — Ambulatory Visit (INDEPENDENT_AMBULATORY_CARE_PROVIDER_SITE_OTHER): Payer: BC Managed Care – PPO | Admitting: Rehabilitative and Restorative Service Providers"

## 2020-10-12 ENCOUNTER — Other Ambulatory Visit: Payer: Self-pay

## 2020-10-12 DIAGNOSIS — R293 Abnormal posture: Secondary | ICD-10-CM | POA: Diagnosis not present

## 2020-10-12 DIAGNOSIS — M6281 Muscle weakness (generalized): Secondary | ICD-10-CM | POA: Diagnosis not present

## 2020-10-12 DIAGNOSIS — M5412 Radiculopathy, cervical region: Secondary | ICD-10-CM

## 2020-10-12 NOTE — Therapy (Signed)
[facility] [address] [place], [place], [address] Phone: [phone]   Fax:  [phone]  Physical Therapy Evaluation  Patient Details  Name: Isaac [name] MRN: [id number] Date of Birth: November 05, 1975 Referring Provider (PT): Cristie Hem PA-C   Encounter Date: 10/12/2020   PT End of Session - 10/12/20 1209    Visit Number 1    Number of Visits 16    Date for PT Re-Evaluation 12/10/20    Authorization Type 60 visits per year allowed    PT Start Time 1100    PT Stop Time 1140    PT Time Calculation (min) 40 min    Activity Tolerance No increased pain;Patient tolerated treatment well    Behavior During Therapy [facility] for tasks assessed/performed           Past Medical History:  Diagnosis Date  . Chronic dryness of both eyes    sees Dr. Lorin Picket   . Kidney stones    sees Dr. Magdalen Spatz     Past Surgical History:  Procedure Laterality Date  . epidural steroid shot     upper and lower spine   . kidney stones     10 years ago  . KNEE ARTHROSCOPY WITH MENISCAL REPAIR Left   . SPINE SURGERY     injections    There were no vitals filed for this visit.    Subjective Assessment - 10/12/20 1152    Subjective Isaac [name] has had neck, R scapular and R UE pain since December of 2021.  He has received an epidural injection which gave him 3-4 days of relief.  Things appear to be getting better although he did get an unfriendly reminder that things are not where he would like them when trying to lift a child to put on his shoulders 2 days ago.  He has had increased scapular and R arm pain since.    Pertinent History Previous back pain and current R UE radiculopathy    Limitations Reading;Lifting    Diagnostic tests MRI    Patient Stated Goals Get rid of scapular and UE pain    Currently in Pain? Yes    Pain Score 4     Pain Location Scapula    Pain Orientation Right    Pain Descriptors / Indicators Aching;Burning;Sore    Pain Type Acute pain     Pain Radiating Towards R hand, particularly 4th and 5th digits    Pain Onset More than a month ago    Pain Frequency Constant    Aggravating Factors  Flexed postures    Pain Relieving Factors Postural correction, heat and tylenol    Effect of Pain on Daily Activities Has to be careful to avoid flexed postures and has been avoiding heavier activities    Multiple Pain Sites No              [facility] PT Assessment - 10/12/20 0001      Assessment   Medical Diagnosis Cervical radiculopathy    Referring Provider (PT) Cristie Hem PA-C    Onset Date/Surgical Date 08/10/20      Balance Screen   Has the patient fallen in the past 6 months No    Has the patient had a decrease in activity level because of a fear of falling?  No    Is the patient reluctant to leave their home because of a fear of falling?  No      Prior Function   Level of Independence  Independent      Cognition   Overall Cognitive Status Within Functional Limits for tasks assessed      Observation/Other Assessments   Focus on Therapeutic Outcomes (FOTO)  58 (74 Goal)      Posture/Postural Control   Posture/Postural Control Postural limitations    Postural Limitations Forward head;Rounded Shoulders;Decreased lumbar lordosis      ROM / Strength   AROM / PROM / Strength AROM;Strength      AROM   Overall AROM  Deficits    AROM Assessment Site Cervical    Cervical Extension 65    Cervical - Right Side Bend 40    Cervical - Left Side Bend 25    Cervical - Right Rotation 40    Cervical - Left Rotation 45      Strength   Overall Strength Deficits    Strength Assessment Site Cervical    Cervical Extension 4/5    Cervical - Right Side Bend 4/5    Cervical - Left Side Bend 3/5                      Objective measurements completed on examination: See above findings.       [facility] Adult PT Treatment/Exercise - 10/12/20 0001      Therapeutic Activites    Therapeutic Activities Other Therapeutic  Activities    Other Therapeutic Activities Reviewed imaging, postural education and anatomy      Exercises   Exercises Neck      Neck Exercises: Standing   Other Standing Exercises Shoulder blade pinches (scapular retraction) 10X 5 seconds    Other Standing Exercises Cervical rotation AROM 10X 5 seconds      Neck Exercises: Seated   Cervical Isometrics Extension;5 secs;10 reps    Lateral Flexion Both;5 reps;Other (comment)    Lateral Flexion Limitations 5 seconds                  PT Education - 10/12/20 1207    Education Details Reviewed imaging, spine anatomy and beginner posture and body mechanics.  Reviewed starter HEP.    Person(s) Educated Patient    Methods Explanation;Demonstration;Verbal cues;Handout    Comprehension Verbalized understanding;Returned demonstration;Need further instruction;Verbal cues required            PT Short Term Goals - 10/12/20 1215      PT SHORT TERM GOAL #1   Title Isaac [name] will report independence and compliance with his staret HEP (4 exercises).    Time 4    Period Weeks    Status New    Target Date 11/12/20      PT SHORT TERM GOAL #2   Title Isaac [name] will report no R UE symptoms for 7+ consecutive days.    Baseline Constant    Time 4    Period Weeks    Status New    Target Date 11/12/20             PT Long Term Goals - 10/12/20 1216      PT LONG TERM GOAL #1   Title Isaac [name] will report neck and scapular pain as consistently 0-2/10 on the Numeric Pain Rating Scale with no complaints of R UE pain.    Baseline Can be 5+/10    Time 8    Period Weeks    Status New    Target Date 12/10/20      PT LONG TERM GOAL #2   Title Improve FOTO score to 74.  Baseline 58    Time 8    Period Weeks    Status New    Target Date 12/10/20      PT LONG TERM GOAL #3   Title Isaac [name] will be independent with his maintenence HEP at DC.    Time 8    Period Weeks    Status New    Target Date 12/10/20      PT LONG TERM GOAL #4   Title  Improve cervical strength for extension and lateral bending to 5/5 MMT.    Baseline See objective.    Time 8    Period Weeks    Status New    Target Date 12/10/20                  Plan - 10/12/20 1210    Clinical Impression Statement Isaac [name] has a cervical radiculopathy affecting the R UE.  Symptoms were getting better before a flare-up 2 days ago when he lifted a child onto his shoulders and got a "sharp" increase in R scpaular and arm pain.  This has been improving but is constant in his R scapula and above the R elbow.  Postural correction, scapular, cervical and postural strengthening should reduce peripheral symptoms and allow Isaac [name] to return to his previous level of function.  Cervical traction may be used if symptoms don't make rapid progress.    Personal Factors and Comorbidities Comorbidity 1    Comorbidities Previous low back pain    Examination-Activity Limitations Sit;Bend;Lift;Caring for Others;Carry;Reach Overhead    Examination-Participation Restrictions Interpersonal Relationship;Occupation;Community Activity    Stability/Clinical Decision Making Stable/Uncomplicated    Clinical Decision Making Low    Rehab Potential Good    PT Frequency 2x / week    PT Duration 8 weeks    PT Treatment/Interventions ADLs/Self Care Home Management;Moist Heat;Cryotherapy;Traction;Therapeutic activities;Therapeutic exercise;Neuromuscular re-education;Patient/family education;Manual techniques;Dry needling    PT Next Visit Plan Postural strengthening (scapular and cervical) along with practical body mechanics    PT Home Exercise Plan Access Code: [email or web address]    Consulted and Agree with Plan of Care Patient           Patient will benefit from skilled therapeutic intervention in order to improve the following deficits and impairments:  Decreased activity tolerance,Decreased endurance,Decreased range of motion,Decreased strength,Increased muscle spasms,Impaired sensation,Impaired UE functional  use,Postural dysfunction,Improper body mechanics,Pain  Visit Diagnosis: Abnormal posture  Radiculopathy, cervical region  Muscle weakness (generalized)     Problem List Patient Active Problem List   Diagnosis Date Noted  . Gastroesophageal reflux disease 07/08/2020  . OAB (overactive bladder) 07/30/2019  . Tremor 07/30/2019  . Gout 04/30/2017  . Chronically dry eyes 01/11/2016  . Back pain 01/11/2016    Cherlyn Cushing 10/12/2020, 12:20 PM  [facility] [address] [place], [place], [address] Phone: [phone]   Fax:  [phone]  Name: Isaac [name] MRN: [id number] Date of Birth: [birth year]

## 2020-10-12 NOTE — Patient Instructions (Signed)
Access Code: [email or web address] URL: [email or web address] Date: 10/12/2020 Prepared by: Pauletta Browns  Exercises Standing Scapular Retraction - 5 x daily - 7 x weekly - 1 sets - 5 reps - 5 seconds hold Seated Cervical Rotation AROM - 3 x daily - 7 x weekly - 1 sets - 10 reps - 5 seconds hold Standing Isometric Cervical Extension with Manual Resistance - 3 x daily - 7 x weekly - 1 sets - 5 reps - 5 seconds hold Standing Isometric Cervical Sidebending with Manual Resistance - 3 x daily - 7 x weekly - 1 sets - 5 reps - 5 seconds hold

## 2020-10-19 ENCOUNTER — Ambulatory Visit (INDEPENDENT_AMBULATORY_CARE_PROVIDER_SITE_OTHER): Payer: BC Managed Care – PPO | Admitting: Physical Therapy

## 2020-10-19 ENCOUNTER — Encounter: Payer: Self-pay | Admitting: Physical Therapy

## 2020-10-19 ENCOUNTER — Other Ambulatory Visit: Payer: Self-pay

## 2020-10-19 DIAGNOSIS — R293 Abnormal posture: Secondary | ICD-10-CM

## 2020-10-19 DIAGNOSIS — M6281 Muscle weakness (generalized): Secondary | ICD-10-CM

## 2020-10-19 DIAGNOSIS — M5412 Radiculopathy, cervical region: Secondary | ICD-10-CM

## 2020-10-19 NOTE — Therapy (Signed)
[facility] [address] [place], [place], [address] Phone: [phone]   Fax:  [phone]  Physical Therapy Treatment  Patient Details  Name: Isaac [name] MRN: [id number] Date of Birth: Aug 06, 1976 Referring Provider (PT): Cristie Hem PA-C   Encounter Date: 10/19/2020   PT End of Session - 10/19/20 1125    Visit Number 2    Number of Visits 16    Date for PT Re-Evaluation 12/10/20    Authorization Type 60 visits per year allowed    PT Start Time 1015    PT Stop Time 1053    PT Time Calculation (min) 38 min    Activity Tolerance No increased pain;Patient tolerated treatment well    Behavior During Therapy [facility] for tasks assessed/performed           Past Medical History:  Diagnosis Date  . Chronic dryness of both eyes    sees Dr. Lorin Picket   . Kidney stones    sees Dr. Magdalen Spatz     Past Surgical History:  Procedure Laterality Date  . epidural steroid shot     upper and lower spine   . kidney stones     10 years ago  . KNEE ARTHROSCOPY WITH MENISCAL REPAIR Left   . SPINE SURGERY     injections    There were no vitals filed for this visit.   Subjective Assessment - 10/19/20 1031    Subjective Pt arriving to therpay reporting feeling better and feels the exercises are helping. Pt did report scapular retraction and cervical rotation caused his pain to increase. Pt was instructed to hold off on that specific exercise and continue the rest of his HEP.    Pertinent History Previous back pain and current R UE radiculopathy    Limitations Reading;Lifting    Diagnostic tests MRI    Patient Stated Goals Get rid of scapular and UE pain    Currently in Pain? Yes    Pain Score 2     Pain Location Scapula    Pain Orientation Right    Pain Descriptors / Indicators Aching    Pain Type Acute pain    Pain Onset More than a month ago    Pain Frequency Constant              [facility] PT Assessment - 10/19/20 0001      AROM    Cervical - Right Rotation 45    Cervical - Left Rotation 55                         [facility] Adult PT Treatment/Exercise - 10/19/20 0001      Exercises   Exercises Neck      Neck Exercises: Theraband   Scapula Retraction 10 reps    Rows Blue;15 reps;Limitations    Rows Limitations 2 sets      Neck Exercises: Standing   Neck Retraction 5 reps;5 secs      Modalities   Modalities Moist Heat      Moist Heat Therapy   Number Minutes Moist Heat 5 Minutes    Moist Heat Location Shoulder   during upper trap stretching and following DN     Manual Therapy   Manual Therapy Soft tissue mobilization    Manual therapy comments --   8 minutes   Soft tissue mobilization upper trap and levator following DN      Neck Exercises: Stretches   Upper Trapezius Stretch  Left;Right;3 reps;20 seconds    Levator Stretch Right;2 reps;10 seconds    Other Neck Stretches Door way sretches 3 positions, (30, 90 , 120 degrees) x 3 holding 20-30 seconds each, QL/thoracic stretch x 2 holding 10 seconds in door way            Trigger Point Dry Needling - 10/19/20 0001    Muscles Treated Head and Neck Upper trapezius;Levator scapulae    Upper Trapezius Response Twitch reponse elicited    Levator Scapulae Response Twitch response elicited                  PT Short Term Goals - 10/19/20 1118      PT SHORT TERM GOAL #1   Title Isaac [name] will report independence and compliance with his staret HEP (4 exercises).    Status On-going      PT SHORT TERM GOAL #2   Title Isaac [name] will report no R UE symptoms for 7+ consecutive days.    Baseline Constant    Status On-going             PT Long Term Goals - 10/12/20 1216      PT LONG TERM GOAL #1   Title Isaac [name] will report neck and scapular pain as consistently 0-2/10 on the Numeric Pain Rating Scale with no complaints of R UE pain.    Baseline Can be 5+/10    Time 8    Period Weeks    Status New    Target Date 12/10/20      PT LONG TERM  GOAL #2   Title Improve FOTO score to 74.    Baseline 58    Time 8    Period Weeks    Status New    Target Date 12/10/20      PT LONG TERM GOAL #3   Title Isaac [name] will be independent with his maintenence HEP at DC.    Time 8    Period Weeks    Status New    Target Date 12/10/20      PT LONG TERM GOAL #4   Title Improve cervical strength for extension and lateral bending to 5/5 MMT.    Baseline See objective.    Time 8    Period Weeks    Status New    Target Date 12/10/20                 Plan - 10/19/20 1110    Clinical Impression Statement Pt still presenting with pin point R upper trap and levator pain. Pt with active trigger points noted. DN performed by Moshe Cipro, PT, DPT and soft tissue mobilization during and following. Pt tolerating more stretches today. Pt reporting that scapular retraction and cervical rotation in his HEP increased his pain. Pt was advised to remove that specific exercise from his HEP and continue with the other exercises provided by previous therapist. Next visit assess response to DN. Continue skilled PT.    Personal Factors and Comorbidities Comorbidity 1    Comorbidities Previous low back pain    Examination-Activity Limitations Sit;Bend;Lift;Caring for Others;Carry;Reach Overhead    Examination-Participation Restrictions Interpersonal Relationship;Occupation;Community Activity    Stability/Clinical Decision Making Stable/Uncomplicated    Rehab Potential Good    PT Frequency 2x / week    PT Duration 8 weeks    PT Treatment/Interventions ADLs/Self Care Home Management;Moist Heat;Cryotherapy;Traction;Therapeutic activities;Therapeutic exercise;Neuromuscular re-education;Patient/family education;Manual techniques;Dry needling    PT Next Visit Plan Assess DN response, Postural strengthening (scapular and  cervical) along with practical body mechanics    PT Home Exercise Plan Access Code: [email or web address]    Consulted and Agree with Plan of Care  Patient           Patient will benefit from skilled therapeutic intervention in order to improve the following deficits and impairments:  Decreased activity tolerance,Decreased endurance,Decreased range of motion,Decreased strength,Increased muscle spasms,Impaired sensation,Impaired UE functional use,Postural dysfunction,Improper body mechanics,Pain  Visit Diagnosis: Abnormal posture  Radiculopathy, cervical region  Muscle weakness (generalized)     Problem List Patient Active Problem List   Diagnosis Date Noted  . Gastroesophageal reflux disease 07/08/2020  . OAB (overactive bladder) 07/30/2019  . Tremor 07/30/2019  . Gout 04/30/2017  . Chronically dry eyes 01/11/2016  . Back pain 01/11/2016    Sharmon Leyden, PT, MPT 10/19/2020, 11:31 AM   Clarita Crane, PT, DPT 10/19/20 11:43 AM     [facility] [address] [place], [place], [address] Phone: [phone]   Fax:  [phone]  Name: Isaac [name] MRN: [id number] Date of Birth: 10-Dec-1975

## 2020-10-21 DIAGNOSIS — J320 Chronic maxillary sinusitis: Secondary | ICD-10-CM | POA: Diagnosis not present

## 2020-10-22 ENCOUNTER — Other Ambulatory Visit: Payer: Self-pay

## 2020-10-22 ENCOUNTER — Ambulatory Visit (INDEPENDENT_AMBULATORY_CARE_PROVIDER_SITE_OTHER): Payer: BC Managed Care – PPO | Admitting: Rehabilitative and Restorative Service Providers"

## 2020-10-22 ENCOUNTER — Encounter: Payer: Self-pay | Admitting: Rehabilitative and Restorative Service Providers"

## 2020-10-22 DIAGNOSIS — M6281 Muscle weakness (generalized): Secondary | ICD-10-CM | POA: Diagnosis not present

## 2020-10-22 DIAGNOSIS — R293 Abnormal posture: Secondary | ICD-10-CM | POA: Diagnosis not present

## 2020-10-22 DIAGNOSIS — M5412 Radiculopathy, cervical region: Secondary | ICD-10-CM | POA: Diagnosis not present

## 2020-10-22 NOTE — Therapy (Signed)
[facility] [address] [place], [place], [address] Phone: [phone]   Fax:  [phone]  Physical Therapy Treatment  Patient Details  Name: Isaac [name] MRN: [id number] Date of Birth: [birth year] Referring Provider (PT): Cristie Hem PA-C   Encounter Date: 10/22/2020   PT End of Session - 10/22/20 1336    Visit Number 3    Number of Visits 16    Date for PT Re-Evaluation 12/10/20    Authorization Type 60 visits per year allowed    PT Start Time 1258    PT Stop Time 1339    PT Time Calculation (min) 41 min    Activity Tolerance No increased pain;Patient tolerated treatment well    Behavior During Therapy [facility] for tasks assessed/performed           Past Medical History:  Diagnosis Date  . Chronic dryness of both eyes    sees Dr. Lorin Picket   . Kidney stones    sees Dr. Magdalen Spatz     Past Surgical History:  Procedure Laterality Date  . epidural steroid shot     upper and lower spine   . kidney stones     10 years ago  . KNEE ARTHROSCOPY WITH MENISCAL REPAIR Left   . SPINE SURGERY     injections    There were no vitals filed for this visit.   Subjective Assessment - 10/22/20 1322    Subjective Pete notes no R UE pain since evaluation.  Scapular and neck pain is present but improved over the past week.    Pertinent History Previous back pain and current R UE radiculopathy    Limitations Reading;Lifting    Diagnostic tests MRI    Patient Stated Goals Get rid of scapular and UE pain    Currently in Pain? Yes    Pain Score 2     Pain Location Neck    Pain Descriptors / Indicators Aching    Pain Type Acute pain    Pain Radiating Towards Scapular (although none today)    Pain Onset More than a month ago    Pain Frequency Intermittent    Aggravating Factors  Flexed postures    Pain Relieving Factors Postural, heat and occasional tylenol    Effect of Pain on Daily Activities Has to be careful with postural awareness to avoid  increase neck, scapular and R UE pain    Multiple Pain Sites No                             [facility] Adult PT Treatment/Exercise - 10/22/20 0001      Posture/Postural Control   Posture/Postural Control Postural limitations    Postural Limitations Forward head;Rounded Shoulders;Decreased lumbar lordosis      Exercises   Exercises Neck      Neck Exercises: Machines for Strengthening   UBE (Upper Arm Bike) Pull only :20 Pull/:10 rest for 5 minutes    Cybex Row 45# 10X limited range, good posture, slow eccentrics    Lat Pull To chest 55# 10X limited range slow eccentrics      Neck Exercises: Theraband   Rows 15 reps;Blue    Rows Limitations slow eccentrics, limited range, good posture    Shoulder External Rotation Green;10 reps      Neck Exercises: Standing   Other Standing Exercises Shoulder blade pinches (scapular retraction) 10X 5 seconds    Other Standing Exercises Cervical rotation AROM 10X  5 seconds      Neck Exercises: Seated   Cervical Isometrics Extension;5 secs;10 reps    Lateral Flexion Both;5 reps;Other (comment)    Lateral Flexion Limitations 5 seconds    Other Seated Exercise Standing B shoulder ER 10X each 3 seconds slow eccentrics Green theraband      Neck Exercises: Prone   Other Prone Exercise Prone alternating arm and leg extensions 10X 3 seconds                  PT Education - 10/22/20 1334    Education Details Reviewed postural education and added prone alternating arm and leg extensions for postural strengthening to HEP.  Progressed gym program.    Person(s) Educated Patient    Methods Explanation;Demonstration;Verbal cues;Handout    Comprehension Verbalized understanding;Need further instruction;Returned demonstration;Verbal cues required;Other (comment)            PT Short Term Goals - 10/22/20 1335      PT SHORT TERM GOAL #1   Title [name] will report independence and compliance with his starter HEP (4 exercises).     Status Achieved      PT SHORT TERM GOAL #2   Title [name] will report no R UE symptoms for 7+ consecutive days.    Baseline Constant    Status On-going             PT Long Term Goals - 10/22/20 1336      PT LONG TERM GOAL #1   Title [name] will report neck and scapular pain as consistently 0-2/10 on the Numeric Pain Rating Scale with no complaints of R UE pain.    Baseline Can be 5+/10    Time 8    Period Weeks    Status On-going      PT LONG TERM GOAL #2   Title Improve FOTO score to 74.    Baseline 58    Time 8    Period Weeks    Status On-going      PT LONG TERM GOAL #3   Title [name] will be independent with his maintenence HEP at DC.    Time 8    Period Weeks    Status On-going      PT LONG TERM GOAL #4   Title Improve cervical strength for extension and lateral bending to 5/5 MMT.    Baseline See objective.    Time 8    Period Weeks    Status On-going                 Plan - 10/22/20 1337    Clinical Impression Statement [name] notes he is "at least 90% better" since starting physical therapy.  R UE symptoms have been absent since evaluation.  Scapular pain is absent today but has been present this week.  Continued postural strength progressions should allow Pete to meet long-term goals in < 4 weeks.    Personal Factors and Comorbidities Comorbidity 1    Comorbidities Previous low back pain    Examination-Activity Limitations Sit;Bend;Lift;Caring for Others;Carry;Reach Overhead    Examination-Participation Restrictions Interpersonal Relationship;Occupation;Community Activity    Stability/Clinical Decision Making Stable/Uncomplicated    Rehab Potential Good    PT Frequency 2x / week    PT Duration 8 weeks    PT Treatment/Interventions ADLs/Self Care Home Management;Moist Heat;Cryotherapy;Traction;Therapeutic activities;Therapeutic exercise;Neuromuscular re-education;Patient/family education;Manual techniques;Dry needling    PT Next Visit Plan Assess DN  response, Postural strengthening (scapular and cervical) along with practical body mechanics  PT Home Exercise Plan Access Code: [email or web address]    Consulted and Agree with Plan of Care Patient           Patient will benefit from skilled therapeutic intervention in order to improve the following deficits and impairments:  Decreased activity tolerance,Decreased endurance,Decreased range of motion,Decreased strength,Increased muscle spasms,Impaired sensation,Impaired UE functional use,Postural dysfunction,Improper body mechanics,Pain  Visit Diagnosis: Abnormal posture  Radiculopathy, cervical region  Muscle weakness (generalized)     Problem List Patient Active Problem List   Diagnosis Date Noted  . Gastroesophageal reflux disease 07/08/2020  . OAB (overactive bladder) 07/30/2019  . Tremor 07/30/2019  . Gout 04/30/2017  . Chronically dry eyes 01/11/2016  . Back pain 01/11/2016    Cherlyn Cushing PT, MPT 10/22/2020, 1:42 PM  [facility] Therapy [address] [place], [place], [address] Phone: [phone]   Fax:  [phone]  Name: EWEL LONA MRN: [id number] Date of Birth: [birth year]

## 2020-10-26 ENCOUNTER — Ambulatory Visit: Payer: BC Managed Care – PPO | Admitting: Physical Therapy

## 2020-10-26 ENCOUNTER — Ambulatory Visit (INDEPENDENT_AMBULATORY_CARE_PROVIDER_SITE_OTHER): Payer: BC Managed Care – PPO | Admitting: Physical Therapy

## 2020-10-26 ENCOUNTER — Other Ambulatory Visit: Payer: Self-pay

## 2020-10-26 ENCOUNTER — Encounter: Payer: Self-pay | Admitting: Physical Therapy

## 2020-10-26 DIAGNOSIS — R293 Abnormal posture: Secondary | ICD-10-CM | POA: Diagnosis not present

## 2020-10-26 DIAGNOSIS — M6281 Muscle weakness (generalized): Secondary | ICD-10-CM

## 2020-10-26 DIAGNOSIS — M5412 Radiculopathy, cervical region: Secondary | ICD-10-CM

## 2020-10-26 NOTE — Therapy (Signed)
[facility] Physical Therapy [address] [place], [place], [address] Phone: [phone]   Fax:  [phone]  Physical Therapy Treatment  Patient Details  Name: Isaac [name] MRN: [id number] Date of Birth: April 25, 1976 Referring Provider (PT): Cristie Hem PA-C   Encounter Date: 10/26/2020   PT End of Session - 10/26/20 1527    Visit Number 4    Number of Visits 16    Date for PT Re-Evaluation 12/10/20    Authorization Type 60 visits per year allowed    PT Start Time 1520    PT Stop Time 1600    PT Time Calculation (min) 40 min    Activity Tolerance No increased pain;Patient tolerated treatment well    Behavior During Therapy [facility] for tasks assessed/performed           Past Medical History:  Diagnosis Date  . Chronic dryness of both eyes    sees Dr. Lorin Picket   . Kidney stones    sees Dr. Magdalen Spatz     Past Surgical History:  Procedure Laterality Date  . epidural steroid shot     upper and lower spine   . kidney stones     10 years ago  . KNEE ARTHROSCOPY WITH MENISCAL REPAIR Left   . SPINE SURGERY     injections    There were no vitals filed for this visit.   Subjective Assessment - 10/26/20 1525    Subjective Pt arriving today reporting no pain at rest. Pt reporting good response to DN at his visit earlier last week and reporting it really seemed to help releive his pain.    Pertinent History Previous back pain and current R UE radiculopathy    Diagnostic tests MRI    Patient Stated Goals Get rid of scapular and UE pain    Currently in Pain? No/denies                             [facility] Adult PT Treatment/Exercise - 10/26/20 0001      Exercises   Exercises Neck      Neck Exercises: Machines for Strengthening   UBE (Upper Arm Bike) 4 minutes forward/ back L3    Cybex Row 45# 3 x 10 working on eccentric control    Cybex Chest Press 35 # 3x10 working on eccentrics    Lat Pull To chest 55# 10X limited range slow  eccentrics    Other Machines for Strengthening BATCA: diagonals D1 and D2 10# 2x10      Neck Exercises: Standing   Other Standing Exercises wall angels x 10    Other Standing Exercises shoulder extension with 3# bar behind pt's back with instructions to keep shoulders from hiking.      Neck Exercises: Stretches   Upper Trapezius Stretch 2 reps;10 seconds    Levator Stretch 2 reps;10 seconds                    PT Short Term Goals - 10/26/20 1551      PT SHORT TERM GOAL #1   Title [name] will report independence and compliance with his starter HEP (4 exercises).    Status Achieved      PT SHORT TERM GOAL #2   Title [name] will report no R UE symptoms for 7+ consecutive days.    Status On-going             PT Long Term Goals -  10/22/20 1336      PT LONG TERM GOAL #1   Title [name] will report neck and scapular pain as consistently 0-2/10 on the Numeric Pain Rating Scale with no complaints of R UE pain.    Baseline Can be 5+/10    Time 8    Period Weeks    Status On-going      PT LONG TERM GOAL #2   Title Improve FOTO score to 74.    Baseline 58    Time 8    Period Weeks    Status On-going      PT LONG TERM GOAL #3   Title [name] will be independent with his maintenence HEP at DC.    Time 8    Period Weeks    Status On-going      PT LONG TERM GOAL #4   Title Improve cervical strength for extension and lateral bending to 5/5 MMT.    Baseline See objective.    Time 8    Period Weeks    Status On-going                 Plan - 10/26/20 1548    Clinical Impression Statement Pt reporting good response to DN earlier last week. Pt reporting no pain upon arrival and no radicular symptoms down R UE. Pt tolerating more strengthening and encouraged to continue with stretching at home and issued HEP. Continue skilled PT.    Personal Factors and Comorbidities Comorbidity 1    Comorbidities Previous low back pain    Examination-Activity Limitations  Sit;Bend;Lift;Caring for Others;Carry;Reach Overhead    Examination-Participation Restrictions Interpersonal Relationship;Occupation;Community Activity    Stability/Clinical Decision Making Stable/Uncomplicated    Rehab Potential Good    PT Frequency 2x / week    PT Duration 8 weeks    PT Treatment/Interventions ADLs/Self Care Home Management;Moist Heat;Cryotherapy;Traction;Therapeutic activities;Therapeutic exercise;Neuromuscular re-education;Patient/family education;Manual techniques;Dry needling    PT Next Visit Plan Postural strengthening (scapular and cervical) along with practical body mechanics    PT Home Exercise Plan Access Code: [email or web address]    Consulted and Agree with Plan of Care Patient           Patient will benefit from skilled therapeutic intervention in order to improve the following deficits and impairments:  Decreased activity tolerance,Decreased endurance,Decreased range of motion,Decreased strength,Increased muscle spasms,Impaired sensation,Impaired UE functional use,Postural dysfunction,Improper body mechanics,Pain  Visit Diagnosis: Abnormal posture  Radiculopathy, cervical region  Muscle weakness (generalized)     Problem List Patient Active Problem List   Diagnosis Date Noted  . Gastroesophageal reflux disease 07/08/2020  . OAB (overactive bladder) 07/30/2019  . Tremor 07/30/2019  . Gout 04/30/2017  . Chronically dry eyes 01/11/2016  . Back pain 01/11/2016    Sharmon Leyden, PT, MPT 10/26/2020, 4:03 PM  [facility] Physical Therapy [address] [place], [place], [address] Phone: [phone]   Fax:  [phone]  Name: Isaac [name] MRN: [id number] Date of Birth: [birth year]

## 2020-10-29 ENCOUNTER — Encounter: Payer: Self-pay | Admitting: Rehabilitative and Restorative Service Providers"

## 2020-10-29 ENCOUNTER — Other Ambulatory Visit: Payer: Self-pay

## 2020-10-29 ENCOUNTER — Ambulatory Visit (INDEPENDENT_AMBULATORY_CARE_PROVIDER_SITE_OTHER): Payer: BC Managed Care – PPO | Admitting: Rehabilitative and Restorative Service Providers"

## 2020-10-29 DIAGNOSIS — M5412 Radiculopathy, cervical region: Secondary | ICD-10-CM

## 2020-10-29 DIAGNOSIS — R293 Abnormal posture: Secondary | ICD-10-CM | POA: Diagnosis not present

## 2020-10-29 DIAGNOSIS — M6281 Muscle weakness (generalized): Secondary | ICD-10-CM

## 2020-10-29 NOTE — Therapy (Signed)
[facility] Physical Therapy [address] [place], [place], [address] Phone: [phone]   Fax:  [phone]  Physical Therapy Treatment  Patient Details  Name: Isaac [name] MRN: [id number] Date of Birth: 08-21-1976 Referring Provider (PT): Cristie Hem PA-C   Encounter Date: 10/29/2020   PT End of Session - 10/29/20 1425    Visit Number 5    Number of Visits 16    Date for PT Re-Evaluation 12/10/20    Authorization Type 60 visits per year allowed    PT Start Time 1351    PT Stop Time 1431    PT Time Calculation (min) 40 min    Activity Tolerance No increased pain;Patient tolerated treatment well    Behavior During Therapy [facility] for tasks assessed/performed           Past Medical History:  Diagnosis Date  . Chronic dryness of both eyes    sees Dr. Lorin Picket   . Kidney stones    sees Dr. Magdalen Spatz     Past Surgical History:  Procedure Laterality Date  . epidural steroid shot     upper and lower spine   . kidney stones     10 years ago  . KNEE ARTHROSCOPY WITH MENISCAL REPAIR Left   . SPINE SURGERY     injections    There were no vitals filed for this visit.   Subjective Assessment - 10/29/20 1402    Subjective Isaac [name] reports no pain at rest.  With prolonged sitting at his computer, he does get some R UE symptoms to maybe mid-arm.    Pertinent History Previous back pain and current R UE radiculopathy    Limitations Sitting;Reading    How long can you sit comfortably? As long as needed with change of position    Diagnostic tests MRI    Patient Stated Goals Get rid of scapular and UE pain    Currently in Pain? No/denies    Pain Score 0-No pain    Aggravating Factors  Flexed postures    Pain Relieving Factors Postural correction and occasional tylenol    Effect of Pain on Daily Activities Needs to watch posture with ADLs to avoid increase UE symptoms.  Better than evaluation.    Multiple Pain Sites No                              [facility] Adult PT Treatment/Exercise - 10/29/20 0001      Posture/Postural Control   Posture/Postural Control Postural limitations    Postural Limitations Forward head;Rounded Shoulders;Decreased lumbar lordosis      Exercises   Exercises Neck      Neck Exercises: Machines for Strengthening   UBE (Upper Arm Bike) Pull only :20 Pull/:10 rest for 5 minutes    Cybex Row 45# 15X limited range, good posture, slow eccentrics    Lat Pull To chest 55# 15X limited range slow eccentrics      Neck Exercises: Theraband   Rows 20 reps;Blue    Rows Limitations slow eccentrics, limited range, good posture    Shoulder External Rotation Green;15 reps      Neck Exercises: Standing   Other Standing Exercises Shoulder blade pinches (scapular retraction) 10X 5 seconds    Other Standing Exercises Cervical rotation AROM 10X 5 seconds      Neck Exercises: Seated   Cervical Isometrics Extension;5 secs;10 reps    Lateral Flexion Both;5 reps;Other (comment)    Lateral  Flexion Limitations 5 seconds    Other Seated Exercise Standing B shoulder ER 10X each 3 seconds slow eccentrics Green theraband      Neck Exercises: Prone   Other Prone Exercise Prone alternating arm and leg extensions 10X 3 seconds      Modalities   Modalities Traction      Traction   Type of Traction Cervical    Min (lbs) 15    Max (lbs) 25    Hold Time Static    Time 8 minutes                  PT Education - 10/29/20 1422    Education Details Reviewed HEP.  Emphasis on increasing walking and prone strengthening.    Person(s) Educated Patient    Methods Explanation;Verbal cues    Comprehension Verbal cues required;Returned demonstration;Verbalized understanding            PT Short Term Goals - 10/29/20 1423      PT SHORT TERM GOAL #1   Title Isaac [name] will report independence and compliance with his starter HEP (4 exercises).    Status Achieved      PT SHORT TERM GOAL #2    Title Isaac [name] will report no R UE symptoms for 7+ consecutive days.    Status On-going             PT Long Term Goals - 10/29/20 1423      PT LONG TERM GOAL #1   Title Isaac [name] will report neck and scapular pain as consistently 0-2/10 on the Numeric Pain Rating Scale with no complaints of R UE pain.    Baseline Can be 5+/10    Time 8    Period Weeks    Status On-going      PT LONG TERM GOAL #2   Title Improve FOTO score to 74.    Baseline 58    Time 8    Period Weeks    Status On-going      PT LONG TERM GOAL #3   Title Isaac [name] will be independent with his maintenence HEP at DC.    Time 8    Period Weeks    Status On-going      PT LONG TERM GOAL #4   Title Improve cervical strength for extension and lateral bending to 5/5 MMT.    Baseline See objective.    Time 8    Period Weeks    Status On-going                 Plan - 10/29/20 1425    Clinical Impression Statement Isaac [name] reports 1X R UE pain over the past 3 days (to mid-humerus).  Compliance has been good with certain activities (shoulder blade pinches) but not as good with others (isometrics, walking, prone strengthening).  With good HEP compliance and careful attention to body mechanics, Isaac [name] should meet long-term goals in March.    Personal Factors and Comorbidities Comorbidity 1    Comorbidities Previous low back pain    Examination-Activity Limitations Sit;Bend;Lift;Caring for Others;Carry;Reach Overhead    Examination-Participation Restrictions Interpersonal Relationship;Occupation;Community Activity    Stability/Clinical Decision Making Stable/Uncomplicated    Rehab Potential Good    PT Frequency 2x / week    PT Duration 8 weeks    PT Treatment/Interventions ADLs/Self Care Home Management;Moist Heat;Cryotherapy;Traction;Therapeutic activities;Therapeutic exercise;Neuromuscular re-education;Patient/family education;Manual techniques;Dry needling    PT Next Visit Plan Postural strengthening (scapular and cervical)  along with practical body mechanics  PT Home Exercise Plan Access Code: [email or web address]    Consulted and Agree with Plan of Care Patient           Patient will benefit from skilled therapeutic intervention in order to improve the following deficits and impairments:  Decreased activity tolerance,Decreased endurance,Decreased range of motion,Decreased strength,Increased muscle spasms,Impaired sensation,Impaired UE functional use,Postural dysfunction,Improper body mechanics,Pain  Visit Diagnosis: Abnormal posture  Radiculopathy, cervical region  Muscle weakness (generalized)     Problem List Patient Active Problem List   Diagnosis Date Noted  . Gastroesophageal reflux disease 07/08/2020  . OAB (overactive bladder) 07/30/2019  . Tremor 07/30/2019  . Gout 04/30/2017  . Chronically dry eyes 01/11/2016  . Back pain 01/11/2016    Cherlyn Cushing PT, MPT 10/29/2020, 2:37 PM  [facility] Physical Therapy [address] [place], [place], [address] Phone: [phone]   Fax:  [phone]  Name: Isaac [name] MRN: [id number] Date of Birth: 10/07/1975

## 2020-11-03 ENCOUNTER — Encounter: Payer: Self-pay | Admitting: Rehabilitative and Restorative Service Providers"

## 2020-11-03 ENCOUNTER — Other Ambulatory Visit: Payer: Self-pay

## 2020-11-03 ENCOUNTER — Ambulatory Visit (INDEPENDENT_AMBULATORY_CARE_PROVIDER_SITE_OTHER): Payer: BC Managed Care – PPO | Admitting: Rehabilitative and Restorative Service Providers"

## 2020-11-03 DIAGNOSIS — M5412 Radiculopathy, cervical region: Secondary | ICD-10-CM

## 2020-11-03 DIAGNOSIS — M6281 Muscle weakness (generalized): Secondary | ICD-10-CM | POA: Diagnosis not present

## 2020-11-03 DIAGNOSIS — R293 Abnormal posture: Secondary | ICD-10-CM

## 2020-11-03 NOTE — Therapy (Signed)
[facility] Physical Therapy [address] [place], [place], [address] Phone: [phone]   Fax:  [phone]  Physical Therapy Treatment  Patient Details  Name: Isaac [name] MRN: [id number] Date of Birth: 11/11/1975 Referring Provider (PT): Cristie Hem PA-C   Encounter Date: 11/03/2020   PT End of Session - 11/03/20 1634    Visit Number 6    Number of Visits 16    Date for PT Re-Evaluation 12/10/20    Authorization Type 60 visits per year allowed    PT Start Time 1554    PT Stop Time 1640    PT Time Calculation (min) 46 min    Activity Tolerance No increased pain;Patient tolerated treatment well    Behavior During Therapy [facility] for tasks assessed/performed           Past Medical History:  Diagnosis Date  . Chronic dryness of both eyes    sees Dr. Lorin Picket   . Kidney stones    sees Dr. Magdalen Spatz     Past Surgical History:  Procedure Laterality Date  . epidural steroid shot     upper and lower spine   . kidney stones     10 years ago  . KNEE ARTHROSCOPY WITH MENISCAL REPAIR Left   . SPINE SURGERY     injections    There were no vitals filed for this visit.   Subjective Assessment - 11/03/20 1600    Subjective Pete reports no pain at rest.  With prolonged sitting at his computer, he does get some R UE symptoms to maybe mid-arm.  Slightly better than last week.    Pertinent History Previous back pain and current R UE radiculopathy    Limitations Sitting;Reading    How long can you sit comfortably? As long as needed with change of position    Diagnostic tests MRI    Patient Stated Goals Get rid of scapular and UE pain    Currently in Pain? Yes    Pain Score 2     Pain Location Arm    Pain Orientation Right    Pain Descriptors / Indicators Aching    Pain Type Chronic pain    Pain Radiating Towards Scapular to upper R arm (short sleeve length)    Pain Onset More than a month ago    Pain Frequency Intermittent    Aggravating Factors   Flexed postures    Pain Relieving Factors Postural correction and occasional tylenol    Effect of Pain on Daily Activities Needs to watch posture with ADLs to avoid increase UE symptoms.  Better than evaluation.    Multiple Pain Sites No                             [facility] Adult PT Treatment/Exercise - 11/03/20 0001      Posture/Postural Control   Posture/Postural Control Postural limitations    Postural Limitations Forward head;Rounded Shoulders;Decreased lumbar lordosis      Exercises   Exercises Neck      Neck Exercises: Machines for Strengthening   UBE (Upper Arm Bike) Pull only :20 Pull/:10 rest for 5 minutes    Cybex Row 45# 15X limited range, good posture, slow eccentrics    Lat Pull To chest 55# 15X limited range slow eccentrics      Neck Exercises: Theraband   Rows 20 reps;Blue    Rows Limitations slow eccentrics, limited range, good posture    Shoulder External  Rotation Green;15 reps   2 sets     Neck Exercises: Standing   Other Standing Exercises Shoulder blade pinches (scapular retraction) 10X 5 seconds    Other Standing Exercises Cervical rotation AROM 10X 5 seconds      Neck Exercises: Seated   Cervical Isometrics Extension;5 secs;10 reps    Lateral Flexion Both;5 reps;Other (comment)    Lateral Flexion Limitations 5 seconds    Other Seated Exercise Standing B shoulder ER 10X each 3 seconds slow eccentrics Green theraband      Neck Exercises: Prone   Other Prone Exercise Prone alternating arm and leg extensions 10X 3 seconds   2 sets     Modalities   Modalities Traction      Traction   Type of Traction Cervical    Min (lbs) 15    Max (lbs) 26    Hold Time Static    Time 8 minutes                  PT Education - 11/03/20 1631    Education Details Reviewed HEP.  Increased emphasis on prone strengthening and walking.  Continue to focus on postural awareness.    Person(s) Educated Patient    Methods  Explanation;Demonstration;Verbal cues    Comprehension Returned demonstration;Need further instruction;Verbal cues required;Verbalized understanding            PT Short Term Goals - 11/03/20 1632      PT SHORT TERM GOAL #1   Title [name] will report independence and compliance with his starter HEP (4 exercises).    Status Achieved      PT SHORT TERM GOAL #2   Title [name] will report no R UE symptoms for 7+ consecutive days.    Baseline Symptoms are largely mid-arm to scapula.    Status On-going             PT Long Term Goals - 11/03/20 1633      PT LONG TERM GOAL #1   Title [name] will report neck and scapular pain as consistently 0-2/10 on the Numeric Pain Rating Scale with no complaints of R UE pain.    Baseline Can be 5+/10    Time 8    Period Weeks    Status On-going      PT LONG TERM GOAL #2   Title Improve FOTO score to 74.    Baseline 58    Time 8    Period Weeks    Status On-going      PT LONG TERM GOAL #3   Title [name] will be independent with his maintenence HEP at DC.    Time 8    Period Weeks    Status On-going      PT LONG TERM GOAL #4   Title Improve cervical strength for extension and lateral bending to 5/5 MMT.    Baseline See objective.    Time 8    Period Weeks    Status On-going                 Plan - 11/03/20 1635    Clinical Impression Statement [name] reports symptoms have largely been scapula to mid-arm (were to hand and to elbow last week).  Postural awareness is improving.  Walking and prone strengthening will benefit from increased compliance.  Continue cervical traction as well as time allows to completely resolve peripheral symptoms.    Personal Factors and Comorbidities Comorbidity 1    Comorbidities Previous low back  pain    Examination-Activity Limitations Sit;Bend;Lift;Caring for Others;Carry;Reach Overhead    Examination-Participation Restrictions Interpersonal Relationship;Occupation;Community Activity    Stability/Clinical  Decision Making Stable/Uncomplicated    Rehab Potential Good    PT Frequency 2x / week    PT Duration 8 weeks    PT Treatment/Interventions ADLs/Self Care Home Management;Moist Heat;Cryotherapy;Traction;Therapeutic activities;Therapeutic exercise;Neuromuscular re-education;Patient/family education;Manual techniques;Dry needling    PT Next Visit Plan Postural strengthening (scapular and cervical) along with practical body mechanics    PT Home Exercise Plan Access Code: [email or web address]    Consulted and Agree with Plan of Care Patient           Patient will benefit from skilled therapeutic intervention in order to improve the following deficits and impairments:  Decreased activity tolerance,Decreased endurance,Decreased range of motion,Decreased strength,Increased muscle spasms,Impaired sensation,Impaired UE functional use,Postural dysfunction,Improper body mechanics,Pain  Visit Diagnosis: Abnormal posture  Radiculopathy, cervical region  Muscle weakness (generalized)     Problem List Patient Active Problem List   Diagnosis Date Noted  . Gastroesophageal reflux disease 07/08/2020  . OAB (overactive bladder) 07/30/2019  . Tremor 07/30/2019  . Gout 04/30/2017  . Chronically dry eyes 01/11/2016  . Back pain 01/11/2016    Cherlyn Cushing PT, MPT 11/03/2020, 4:45 PM  [facility] Physical Therapy [address] [place], [place], [address] Phone: [phone]   Fax:  [phone]  Name: JAQUIN COY MRN: [id number] Date of Birth: Sep 10, 1975

## 2020-11-05 ENCOUNTER — Encounter: Payer: BC Managed Care – PPO | Admitting: Rehabilitative and Restorative Service Providers"

## 2020-11-09 ENCOUNTER — Ambulatory Visit (INDEPENDENT_AMBULATORY_CARE_PROVIDER_SITE_OTHER): Payer: BC Managed Care – PPO | Admitting: Physical Therapy

## 2020-11-09 ENCOUNTER — Encounter: Payer: Self-pay | Admitting: Physical Therapy

## 2020-11-09 ENCOUNTER — Other Ambulatory Visit: Payer: Self-pay

## 2020-11-09 DIAGNOSIS — R293 Abnormal posture: Secondary | ICD-10-CM

## 2020-11-09 DIAGNOSIS — M5412 Radiculopathy, cervical region: Secondary | ICD-10-CM

## 2020-11-09 DIAGNOSIS — M6281 Muscle weakness (generalized): Secondary | ICD-10-CM | POA: Diagnosis not present

## 2020-11-09 NOTE — Therapy (Signed)
[facility] Physical Therapy [address] [place], [place], [address] Phone: [phone]   Fax:  [phone]  Physical Therapy Treatment  Patient Details  Name: Isaac [name] MRN: [id number] Date of Birth: 03/11/1976 Referring Provider (PT): Cristie Hem PA-C   Encounter Date: 11/09/2020   PT End of Session - 11/09/20 1516    Visit Number 7    Number of Visits 16    Date for PT Re-Evaluation 12/10/20    Authorization Type 60 visits per year allowed    PT Start Time 1430    PT Stop Time 1525    PT Time Calculation (min) 55 min    Activity Tolerance No increased pain;Patient tolerated treatment well    Behavior During Therapy [facility] for tasks assessed/performed           Past Medical History:  Diagnosis Date  . Chronic dryness of both eyes    sees Dr. Lorin Picket   . Kidney stones    sees Dr. Magdalen Spatz     Past Surgical History:  Procedure Laterality Date  . epidural steroid shot     upper and lower spine   . kidney stones     10 years ago  . KNEE ARTHROSCOPY WITH MENISCAL REPAIR Left   . SPINE SURGERY     injections    There were no vitals filed for this visit.   Subjective Assessment - 11/09/20 1515    Subjective Pt arriving to therapy with 1/10 pain in neck. Pt reporting mowing yard yesterday and that may have aggrivated it.    Pertinent History Previous back pain and current R UE radiculopathy    Limitations Sitting;Reading    How long can you sit comfortably? As long as needed with change of position    Diagnostic tests MRI    Patient Stated Goals Get rid of scapular and UE pain    Currently in Pain? Yes    Pain Score 1     Pain Location Neck    Pain Orientation Lower;Right    Pain Descriptors / Indicators Sore    Pain Type Chronic pain    Pain Onset More than a month ago                             [facility] Adult PT Treatment/Exercise - 11/09/20 0001      Exercises   Exercises Neck      Neck Exercises: Machines  for Strengthening   UBE (Upper Arm Bike) 2 minutes forward and back, alternating every 2 minutes    Cybex Row 45# 15X limited range, good posture, slow eccentrics    Lat Pull To chest 55# 15X limited range slow eccentrics      Neck Exercises: Theraband   Shoulder External Rotation Green;15 reps   2 sets     Neck Exercises: Standing   Other Standing Exercises shoulder extension 3# bar with scapular extension 2x15    Other Standing Exercises Cervical rotation AROM 10X 5 seconds      Neck Exercises: Seated   Lateral Flexion Both;5 reps;Other (comment)    Lateral Flexion Limitations 5 seconds      Neck Exercises: Prone   Other Prone Exercise Prone alternating arm and leg extensions 10X 3 seconds   2 sets     Modalities   Modalities Traction      Traction   Type of Traction Cervical    Min (lbs) 15  Max (lbs) 26    Hold Time 60    Rest Time 10    Time 15 minutes                    PT Short Term Goals - 11/09/20 1519      PT SHORT TERM GOAL #1   Title Isaac [name] will report independence and compliance with his starter HEP (4 exercises).    Status Achieved      PT SHORT TERM GOAL #2   Title Isaac [name] will report no R UE symptoms for 7+ consecutive days.    Status On-going             PT Long Term Goals - 11/09/20 1519      PT LONG TERM GOAL #1   Title Isaac [name] will report neck and scapular pain as consistently 0-2/10 on the Numeric Pain Rating Scale with no complaints of R UE pain.    Status On-going      PT LONG TERM GOAL #2   Title Improve FOTO score to 74.    Status On-going      PT LONG TERM GOAL #3   Title Isaac [name] will be independent with his maintenence HEP at DC.    Status On-going      PT LONG TERM GOAL #4   Title Improve cervical strength for extension and lateral bending to 5/5 MMT.    Status On-going                 Plan - 11/09/20 1517    Clinical Impression Statement Pt arriving reporting 1/10 pain in his right sided upper trap, neck. Pt  reporting improvements since beginning therapy. Pt tolerating treatment well cervical traction performed at end of session. Continue skilled PT progressing toward goals set.    Personal Factors and Comorbidities Comorbidity 1    Comorbidities Previous low back pain    Examination-Activity Limitations Sit;Bend;Lift;Caring for Others;Carry;Reach Overhead    Examination-Participation Restrictions Interpersonal Relationship;Occupation;Community Activity    Stability/Clinical Decision Making Stable/Uncomplicated    Rehab Potential Good    PT Frequency 2x / week    PT Duration 8 weeks    PT Treatment/Interventions ADLs/Self Care Home Management;Moist Heat;Cryotherapy;Traction;Therapeutic activities;Therapeutic exercise;Neuromuscular re-education;Patient/family education;Manual techniques;Dry needling    PT Next Visit Plan Postural strengthening (scapular and cervical) along with practical body mechanics    PT Home Exercise Plan Access Code: [email or web address]    Consulted and Agree with Plan of Care Patient           Patient will benefit from skilled therapeutic intervention in order to improve the following deficits and impairments:  Decreased activity tolerance,Decreased endurance,Decreased range of motion,Decreased strength,Increased muscle spasms,Impaired sensation,Impaired UE functional use,Postural dysfunction,Improper body mechanics,Pain  Visit Diagnosis: Abnormal posture  Radiculopathy, cervical region  Muscle weakness (generalized)     Problem List Patient Active Problem List   Diagnosis Date Noted  . Gastroesophageal reflux disease 07/08/2020  . OAB (overactive bladder) 07/30/2019  . Tremor 07/30/2019  . Gout 04/30/2017  . Chronically dry eyes 01/11/2016  . Back pain 01/11/2016    Sharmon Leyden, PT, MPT 11/09/2020, 3:20 PM  [facility] Physical Therapy [address] [place], [place], [address] Phone: [phone]   Fax:  [phone]  Name: Isaac [name] MRN: [id number] Date of Birth: [birth year]

## 2020-11-12 ENCOUNTER — Encounter: Payer: BC Managed Care – PPO | Admitting: Rehabilitative and Restorative Service Providers"

## 2020-11-12 ENCOUNTER — Telehealth: Payer: Self-pay | Admitting: Rehabilitative and Restorative Service Providers"

## 2020-11-12 NOTE — Telephone Encounter (Signed)
Left message to schedule appointments at [phone].

## 2020-11-15 ENCOUNTER — Other Ambulatory Visit: Payer: Self-pay

## 2020-11-15 ENCOUNTER — Ambulatory Visit (INDEPENDENT_AMBULATORY_CARE_PROVIDER_SITE_OTHER): Payer: BC Managed Care – PPO | Admitting: Physical Therapy

## 2020-11-15 DIAGNOSIS — M5412 Radiculopathy, cervical region: Secondary | ICD-10-CM

## 2020-11-15 DIAGNOSIS — M6281 Muscle weakness (generalized): Secondary | ICD-10-CM

## 2020-11-15 DIAGNOSIS — R293 Abnormal posture: Secondary | ICD-10-CM | POA: Diagnosis not present

## 2020-11-15 NOTE — Therapy (Addendum)
[facility] Physical Therapy [address] [place], [place], [address] Phone: [phone]   Fax:  [phone]  Physical Therapy Treatment  Patient Details  Name: Isaac [name] MRN: [id number] Date of Birth: 1976-02-04 Referring Provider (PT): Aundra Dubin PA-C  PHYSICAL THERAPY DISCHARGE SUMMARY  Visits from Start of Care: 8  Current functional level related to goals / functional outcomes: See note   Remaining deficits: See note   Education / Equipment: HEP  Plan:                                                    Patient goals were partially met. Patient is being discharged due to not returning since the last visit.  ?????     Encounter Date: 11/15/2020   PT End of Session - 11/15/20 1508    Visit Number 8    Number of Visits 16    Date for PT Re-Evaluation 12/10/20    Authorization Type 60 visits per year allowed    PT Start Time 1430    PT Stop Time 1510    PT Time Calculation (min) 40 min    Activity Tolerance No increased pain;Patient tolerated treatment well    Behavior During Therapy [facility] for tasks assessed/performed           Past Medical History:  Diagnosis Date  . Chronic dryness of both eyes    sees Dr. Nicki Reaper   . Kidney stones    sees Dr. Orma Render     Past Surgical History:  Procedure Laterality Date  . epidural steroid shot     upper and lower spine   . kidney stones     10 years ago  . KNEE ARTHROSCOPY WITH MENISCAL REPAIR Left   . SPINE SURGERY     injections    There were no vitals filed for this visit.   Subjective Assessment - 11/15/20 1451    Subjective Pt arriving to therapy with some pain and soreness after increased yard work. He does not provide numerircal pain scale rating. he also states he notices his tricep is not as strong on his Rt side    Pertinent History Previous back pain and current R UE radiculopathy    Limitations Sitting;Reading    How long can you sit comfortably? As long as needed with  change of position    Diagnostic tests MRI    Patient Stated Goals Get rid of scapular and UE pain    Pain Onset More than a month ago            [facility] Adult PT Treatment/Exercise - 11/15/20 0001      Neck Exercises: Machines for Strengthening   UBE (Upper Arm Bike) 3 min fwd, 3 min reverse L5    Cybex Row --    Lat Pull --      Neck Exercises: Theraband   Shoulder External Rotation 20 reps;Blue    Shoulder External Rotation Limitations bilat at same time    Horizontal ABduction 20 reps;Blue      Neck Exercises: Standing   Other Standing Exercises wall push ups 3 sets of 10.    Other Standing Exercises tricep kickback (pain with this so discontinued), tricep press down, and tricep OH extension all with blue band  x15 ea      Traction  Min (lbs) 15    Max (lbs) 26    Hold Time 60    Rest Time 10    Time 15 minutes                    PT Short Term Goals - 11/15/20 1515      PT SHORT TERM GOAL #1   Title Laurey Arrow will report independence and compliance with his starter HEP (4 exercises).    Status Achieved      PT SHORT TERM GOAL #2   Title Laurey Arrow will report no R UE symptoms for 7+ consecutive days.    Status Achieved             PT Long Term Goals - 11/15/20 1515      PT LONG TERM GOAL #1   Title Laurey Arrow will report neck and scapular pain as consistently 0-2/10 on the Numeric Pain Rating Scale with no complaints of R UE pain.    Status On-going      PT LONG TERM GOAL #2   Title Improve FOTO score to 74.    Status On-going      PT LONG TERM GOAL #3   Title Laurey Arrow will be independent with his maintenence HEP at DC.    Status On-going      PT LONG TERM GOAL #4   Title Improve cervical strength for extension and lateral bending to 5/5 MMT.    Status On-going                 Plan - 11/15/20 1513    Clinical Impression Statement He feels good where he is at functionally at this time and wants to place PT on hold while he continues to work on HEP.  We did add more tricep work into his HEP today after weakness noted here. He will contact PT in the event he feels he needs to and we will discharge if we do not hear back within 60 days.    Personal Factors and Comorbidities Comorbidity 1    Comorbidities Previous low back pain    Examination-Activity Limitations Sit;Bend;Lift;Caring for Others;Carry;Reach Overhead    Examination-Participation Restrictions Interpersonal Relationship;Occupation;Community Activity    Stability/Clinical Decision Making Stable/Uncomplicated    Rehab Potential Good    PT Frequency 2x / week    PT Duration 8 weeks    PT Treatment/Interventions ADLs/Self Care Home Management;Moist Heat;Cryotherapy;Traction;Therapeutic activities;Therapeutic exercise;Neuromuscular re-education;Patient/family education;Manual techniques;Dry needling    PT Next Visit Plan holding PT for now to trial HEP    PT Home Exercise Plan Access Code: [email or web address], added tricep extension and pulldown and wall push ups    Consulted and Agree with Plan of Care Patient           Patient will benefit from skilled therapeutic intervention in order to improve the following deficits and impairments:  Decreased activity tolerance,Decreased endurance,Decreased range of motion,Decreased strength,Increased muscle spasms,Impaired sensation,Impaired UE functional use,Postural dysfunction,Improper body mechanics,Pain  Visit Diagnosis: Abnormal posture  Radiculopathy, cervical region  Muscle weakness (generalized)     Problem List Patient Active Problem List   Diagnosis Date Noted  . Gastroesophageal reflux disease 07/08/2020  . OAB (overactive bladder) 07/30/2019  . Tremor 07/30/2019  . Gout 04/30/2017  . Chronically dry eyes 01/11/2016  . Back pain 01/11/2016    Silvestre Mesi 11/15/2020, 3:16 PM  Farley Ly PT, MPT  [facility] Physical Therapy [address] [place], [place], [address] Phone: [phone]   Fax:  [phone]  Name: Isaac [name] MRN: [id number] Date of Birth: [birth year]

## 2020-11-30 ENCOUNTER — Ambulatory Visit: Payer: BC Managed Care – PPO | Admitting: Family Medicine

## 2020-11-30 ENCOUNTER — Encounter: Payer: Self-pay | Admitting: Family Medicine

## 2020-11-30 ENCOUNTER — Other Ambulatory Visit: Payer: Self-pay

## 2020-11-30 VITALS — BP 110/80 | HR 78 | Temp 98.6°F | Wt 229.0 lb

## 2020-11-30 DIAGNOSIS — J0191 Acute recurrent sinusitis, unspecified: Secondary | ICD-10-CM

## 2020-11-30 MED ORDER — LEVOFLOXACIN 500 MG PO TABS: 500.0000 mg | ORAL_TABLET | Freq: Every day | ORAL | 0 refills | Status: AC

## 2020-11-30 NOTE — Progress Notes (Signed)
   Subjective:    Patient ID: Isaac [name], male    DOB: 1976-05-04, 45 y.o.   MRN: [id number]  HPI Here for recurrent sinus infection symptoms. About 4 weeks ago he developed since pressure and PND and he had a televisit with someone through his insurance company. She gave him 10 days of Amoxicillin 500 mg BID, and this helped him fel better but he never got all the back to normal. Now over the past week he has developed more sinus pressure, ear pain, PND and a ST. No fever or cough. No SOB or body aches. No NVD. Using Zyrtec and Zyrtec D.    Review of Systems  Constitutional: Negative.   HENT: Positive for congestion, ear pain, postnasal drip, sinus pressure and sore throat. Negative for hearing loss.   Eyes: Negative.   Respiratory: Negative.   Cardiovascular: Negative.   Gastrointestinal: Negative.        Objective:   Physical Exam Constitutional:      Appearance: Normal appearance. He is not ill-appearing.  HENT:     Right Ear: Tympanic membrane, ear canal and external ear normal.     Left Ear: Tympanic membrane, ear canal and external ear normal.     Nose: Nose normal.     Mouth/Throat:     Pharynx: Oropharynx is clear.  Eyes:     Conjunctiva/sclera: Conjunctivae normal.  Cardiovascular:     Rate and Rhythm: Normal rate and regular rhythm.     Pulses: Normal pulses.     Heart sounds: Normal heart sounds.  Pulmonary:     Effort: Pulmonary effort is normal.     Breath sounds: Normal breath sounds.  Lymphadenopathy:     Cervical: No cervical adenopathy.  Neurological:     Mental Status: He is alert.           Assessment & Plan:  Partially treated sinusitis, we will treat with 10 days of Levaquin.  Gershon Crane, MD

## 2020-12-27 ENCOUNTER — Other Ambulatory Visit: Payer: Self-pay

## 2020-12-27 ENCOUNTER — Ambulatory Visit: Payer: BC Managed Care – PPO | Admitting: Family Medicine

## 2020-12-27 ENCOUNTER — Encounter: Payer: Self-pay | Admitting: Family Medicine

## 2020-12-27 VITALS — BP 118/76 | HR 80 | Temp 98.5°F | Wt 226.8 lb

## 2020-12-27 DIAGNOSIS — J0191 Acute recurrent sinusitis, unspecified: Secondary | ICD-10-CM | POA: Diagnosis not present

## 2020-12-27 MED ORDER — METHYLPREDNISOLONE ACETATE 40 MG/ML IJ SUSP
40.0000 mg | Freq: Once | INTRAMUSCULAR | Status: AC
  Administered 2020-12-27: 40 mg via INTRAMUSCULAR

## 2020-12-27 MED ORDER — METHYLPREDNISOLONE ACETATE 80 MG/ML IJ SUSP
80.0000 mg | Freq: Once | INTRAMUSCULAR | Status: AC
  Administered 2020-12-27: 80 mg via INTRAMUSCULAR

## 2020-12-27 MED ORDER — LEVOFLOXACIN 500 MG PO TABS: 500.0000 mg | ORAL_TABLET | Freq: Every day | ORAL | 0 refills | Status: AC

## 2020-12-27 NOTE — Addendum Note (Signed)
Addended by: Carola Rhine on: 12/27/2020 02:21 PM   Modules accepted: Orders

## 2020-12-27 NOTE — Progress Notes (Signed)
   Subjective:    Patient ID: Isaac [name], male    DOB: Apr 25, 1976, 45 y.o.   MRN: [id number]  HPI Here for 6 days of a dry cough, sinus congestion, PND, and a ST. No fever. Taking Mucinex DM. On 12-10-20 we treated him for a sinus infection with Levaquin, and he says he recovered completely, at least until now. He thinks this was triggered when he mowed his grass last week with out wearing a mask (which he usually wears).    Review of Systems  Constitutional: Negative.   HENT: Positive for congestion, postnasal drip, sinus pressure and sore throat. Negative for ear pain.   Eyes: Negative.   Respiratory: Positive for cough. Negative for shortness of breath and wheezing.   Cardiovascular: Negative.        Objective:   Physical Exam Constitutional:      Appearance: Normal appearance. He is not ill-appearing.  HENT:     Right Ear: Tympanic membrane, ear canal and external ear normal.     Left Ear: Tympanic membrane, ear canal and external ear normal.     Nose: Nose normal.     Mouth/Throat:     Pharynx: Oropharynx is clear.  Eyes:     Conjunctiva/sclera: Conjunctivae normal.  Cardiovascular:     Rate and Rhythm: Normal rate and regular rhythm.     Pulses: Normal pulses.     Heart sounds: Normal heart sounds.  Pulmonary:     Effort: Pulmonary effort is normal. No respiratory distress.     Breath sounds: Normal breath sounds. No stridor. No wheezing, rhonchi or rales.  Lymphadenopathy:     Cervical: No cervical adenopathy.  Neurological:     Mental Status: He is alert.           Assessment & Plan:  Recurrent sinusitis, treat with Levaquin again. Given a shot of DepoMedrol.  Gershon Crane, MD

## 2020-12-30 ENCOUNTER — Encounter: Payer: Self-pay | Admitting: Family Medicine

## 2020-12-30 MED ORDER — BENZONATATE 200 MG PO CAPS: 200.0000 mg | ORAL_CAPSULE | Freq: Three times a day (TID) | ORAL | 0 refills | Status: DC | PRN

## 2020-12-30 MED ORDER — ALBUTEROL SULFATE HFA 108 (90 BASE) MCG/ACT IN AERS: 2.0000 | INHALATION_SPRAY | RESPIRATORY_TRACT | 0 refills | Status: DC | PRN

## 2020-12-30 NOTE — Telephone Encounter (Signed)
I sent in Benzonatate capsules to help calm the coughing, as well as an inhaler to help with wheezing and chest tightness

## 2021-01-07 MED ORDER — METHYLPREDNISOLONE 4 MG PO TBPK: ORAL_TABLET | ORAL | 0 refills | Status: DC

## 2021-01-07 NOTE — Telephone Encounter (Signed)
I sent in a prednisone pack to calm his airways down. This should help with breathing and coughing

## 2021-01-26 ENCOUNTER — Other Ambulatory Visit: Payer: Self-pay | Admitting: Family Medicine

## 2021-02-07 ENCOUNTER — Other Ambulatory Visit: Payer: Self-pay

## 2021-02-08 ENCOUNTER — Encounter: Payer: Self-pay | Admitting: Family Medicine

## 2021-02-08 ENCOUNTER — Ambulatory Visit: Payer: BC Managed Care – PPO | Admitting: Family Medicine

## 2021-02-08 VITALS — BP 124/76 | HR 72 | Temp 98.7°F | Wt 230.0 lb

## 2021-02-08 DIAGNOSIS — J3089 Other allergic rhinitis: Secondary | ICD-10-CM | POA: Diagnosis not present

## 2021-02-08 NOTE — Progress Notes (Signed)
   Subjective:    Patient ID: Isaac [name], male    DOB: [birth year], 45 y.o.   MRN: [id number]  HPI Here to ask about allergy testing. He knows he is allergic to certain plants (like orange trees and pine trees) but he has never been tested. We treated him in April and again in May for sinus infections, using Levaquin each time, and these seem to have resolved. However he continues to have itching over his body, a dry cough, and slight wheezing. No cough or fever. He uses his albuterol inhaler once or twice a week.    Review of Systems  Constitutional: Negative.   HENT:  Positive for congestion, postnasal drip and rhinorrhea. Negative for sinus pressure and sore throat.   Eyes: Negative.   Respiratory:  Positive for cough and wheezing. Negative for shortness of breath.   Cardiovascular: Negative.   Skin:  Negative for rash.      Objective:   Physical Exam Constitutional:      Appearance: Normal appearance. He is not ill-appearing.  Cardiovascular:     Rate and Rhythm: Normal rate and regular rhythm.     Pulses: Normal pulses.     Heart sounds: Normal heart sounds.  Pulmonary:     Effort: Pulmonary effort is normal.     Breath sounds: Normal breath sounds.  Musculoskeletal:     Right lower leg: No edema.     Left lower leg: No edema.  Neurological:     Mental Status: He is alert.          Assessment & Plan:  Seasonal and environmental allergies. Refer to Allergy for formal testing. I suggested he take Allegra daily. Gershon Crane, MD

## 2021-02-16 ENCOUNTER — Other Ambulatory Visit: Payer: Self-pay

## 2021-02-16 ENCOUNTER — Encounter: Payer: Self-pay | Admitting: Family Medicine

## 2021-02-16 ENCOUNTER — Ambulatory Visit (INDEPENDENT_AMBULATORY_CARE_PROVIDER_SITE_OTHER)
Admission: RE | Admit: 2021-02-16 | Discharge: 2021-02-16 | Disposition: A | Discharge status: Home / Self Care | Payer: BC Managed Care – PPO | Source: Ambulatory Visit | Attending: Family Medicine | Admitting: Family Medicine

## 2021-02-16 ENCOUNTER — Ambulatory Visit: Payer: BC Managed Care – PPO | Admitting: Family Medicine

## 2021-02-16 VITALS — BP 112/78 | HR 74 | Temp 98.6°F | Wt 229.0 lb

## 2021-02-16 DIAGNOSIS — R059 Cough, unspecified: Secondary | ICD-10-CM

## 2021-02-16 DIAGNOSIS — J40 Bronchitis, not specified as acute or chronic: Secondary | ICD-10-CM

## 2021-02-16 MED ORDER — DOXYCYCLINE HYCLATE 100 MG PO CAPS: 100.0000 mg | ORAL_CAPSULE | Freq: Two times a day (BID) | ORAL | 0 refills | Status: AC

## 2021-02-16 NOTE — Progress Notes (Signed)
   Subjective:    Patient ID: Isaac [name], male    DOB: 08/23/1975, 45 y.o.   MRN: [id number]  HPI Here for continued coughing for the past 6 weeks or so. We have referred him to Allergy, and he is scheduled to see them on August 12. He is using Benzonatate with no relief. The cough has changed in the past week. He has more chest congestion and he is now coughing up yellow sputum. Still no fever.    Review of Systems  Constitutional: Negative.   HENT: Negative.    Eyes: Negative.   Respiratory:  Positive for cough. Negative for shortness of breath.   Cardiovascular: Negative.       Objective:   Physical Exam Constitutional:      Appearance: Normal appearance. He is not ill-appearing.  Cardiovascular:     Rate and Rhythm: Normal rate and regular rhythm.     Pulses: Normal pulses.     Heart sounds: Normal heart sounds.  Pulmonary:     Effort: Pulmonary effort is normal.     Breath sounds: Normal breath sounds.  Neurological:     Mental Status: He is alert.          Assessment & Plan:  Chronic cough, now with a possible bronchitis. We will treat this with 10 days of Doxycycline. We will also send him for a CXR today. Gershon Crane, MD

## 2021-02-18 ENCOUNTER — Encounter: Payer: Self-pay | Admitting: Family Medicine

## 2021-02-18 NOTE — Telephone Encounter (Signed)
Pt has been scheduled for a f/u with Dr Clent Ridges on Wed 02/23/2021 at 4.30 pm

## 2021-02-23 ENCOUNTER — Ambulatory Visit: Payer: BC Managed Care – PPO | Admitting: Family Medicine

## 2021-03-02 DIAGNOSIS — J3089 Other allergic rhinitis: Secondary | ICD-10-CM | POA: Diagnosis not present

## 2021-03-02 DIAGNOSIS — J301 Allergic rhinitis due to pollen: Secondary | ICD-10-CM | POA: Diagnosis not present

## 2021-03-02 DIAGNOSIS — R052 Subacute cough: Secondary | ICD-10-CM | POA: Diagnosis not present

## 2021-03-02 DIAGNOSIS — T781XXD Other adverse food reactions, not elsewhere classified, subsequent encounter: Secondary | ICD-10-CM | POA: Diagnosis not present

## 2021-03-13 DIAGNOSIS — J0100 Acute maxillary sinusitis, unspecified: Secondary | ICD-10-CM | POA: Diagnosis not present

## 2021-03-14 ENCOUNTER — Encounter: Payer: Self-pay | Admitting: Family Medicine

## 2021-03-14 NOTE — Telephone Encounter (Signed)
Please advise 

## 2021-03-15 ENCOUNTER — Ambulatory Visit: Payer: BC Managed Care – PPO | Admitting: Family Medicine

## 2021-03-15 ENCOUNTER — Other Ambulatory Visit: Payer: Self-pay

## 2021-03-15 ENCOUNTER — Encounter: Payer: Self-pay | Admitting: Family Medicine

## 2021-03-15 VITALS — BP 116/80 | HR 80 | Temp 98.3°F | Ht 72.0 in | Wt 230.2 lb

## 2021-03-15 DIAGNOSIS — J019 Acute sinusitis, unspecified: Secondary | ICD-10-CM | POA: Diagnosis not present

## 2021-03-15 DIAGNOSIS — H6981 Other specified disorders of Eustachian tube, right ear: Secondary | ICD-10-CM

## 2021-03-15 MED ORDER — METHYLPREDNISOLONE ACETATE 80 MG/ML IJ SUSP
80.0000 mg | Freq: Once | INTRAMUSCULAR | Status: AC
  Administered 2021-03-15: 80 mg via INTRAMUSCULAR

## 2021-03-15 MED ORDER — METHYLPREDNISOLONE ACETATE 40 MG/ML IJ SUSP
40.0000 mg | Freq: Once | INTRAMUSCULAR | Status: AC
  Administered 2021-03-15: 40 mg via INTRAMUSCULAR

## 2021-03-15 NOTE — Addendum Note (Signed)
Addended by: Mary Sella D on: 03/15/2021 04:28 PM   Modules accepted: Orders

## 2021-03-15 NOTE — Progress Notes (Signed)
   Subjective:    Patient ID: Isaac [name], male    DOB: 1976-01-24, 45 y.o.   MRN: [id number]  HPI Here for congestion and mild pain in the right ear. He has had sinus pressure and PND for about a week. No fever. He has dry cough which is chronic. He is using Flonase and Xyzal. No cough or SOB. He had a televisit a few days ago and he was prescribed Augmentin for 10 days . He is now on Day 3 of this course.    Review of Systems  Constitutional: Negative.   HENT:  Positive for congestion, ear pain, hearing loss, postnasal drip and sinus pressure. Negative for sinus pain and sore throat.   Eyes: Negative.   Respiratory: Negative.        Objective:   Physical Exam Constitutional:      Appearance: Normal appearance.  HENT:     Right Ear: Tympanic membrane, ear canal and external ear normal.     Left Ear: Tympanic membrane, ear canal and external ear normal.     Nose: Nose normal.     Mouth/Throat:     Mouth: Mucous membranes are moist.  Eyes:     Conjunctiva/sclera: Conjunctivae normal.  Cardiovascular:     Rate and Rhythm: Normal rate and regular rhythm.     Pulses: Normal pulses.     Heart sounds: Normal heart sounds.  Pulmonary:     Effort: Pulmonary effort is normal.     Breath sounds: Normal breath sounds.  Musculoskeletal:     Cervical back: Neck supple.  Lymphadenopathy:     Cervical: No cervical adenopathy.  Neurological:     Mental Status: He is alert.          Assessment & Plan:  Sinusitis with eustachian tube dysfunction. He will finish the Augmentin. He is given a shot of DepoMedrol today. He will also take Mucinex D BID. Recheck as needed.  Gershon Crane, MD

## 2021-03-15 NOTE — Telephone Encounter (Signed)
I understand what he meant. I would take the Augmentin. If he has a sinus infection, that should take care of it

## 2021-03-16 DIAGNOSIS — J301 Allergic rhinitis due to pollen: Secondary | ICD-10-CM | POA: Diagnosis not present

## 2021-03-17 DIAGNOSIS — J3089 Other allergic rhinitis: Secondary | ICD-10-CM | POA: Diagnosis not present

## 2021-03-31 DIAGNOSIS — J3089 Other allergic rhinitis: Secondary | ICD-10-CM | POA: Diagnosis not present

## 2021-03-31 DIAGNOSIS — J301 Allergic rhinitis due to pollen: Secondary | ICD-10-CM | POA: Diagnosis not present

## 2021-04-05 DIAGNOSIS — J301 Allergic rhinitis due to pollen: Secondary | ICD-10-CM | POA: Diagnosis not present

## 2021-04-05 DIAGNOSIS — J3089 Other allergic rhinitis: Secondary | ICD-10-CM | POA: Diagnosis not present

## 2021-04-08 DIAGNOSIS — J301 Allergic rhinitis due to pollen: Secondary | ICD-10-CM | POA: Diagnosis not present

## 2021-04-08 DIAGNOSIS — J3089 Other allergic rhinitis: Secondary | ICD-10-CM | POA: Diagnosis not present

## 2021-04-12 DIAGNOSIS — J301 Allergic rhinitis due to pollen: Secondary | ICD-10-CM | POA: Diagnosis not present

## 2021-04-12 DIAGNOSIS — J3089 Other allergic rhinitis: Secondary | ICD-10-CM | POA: Diagnosis not present

## 2021-04-15 DIAGNOSIS — J3089 Other allergic rhinitis: Secondary | ICD-10-CM | POA: Diagnosis not present

## 2021-04-15 DIAGNOSIS — J301 Allergic rhinitis due to pollen: Secondary | ICD-10-CM | POA: Diagnosis not present

## 2021-04-18 DIAGNOSIS — J3089 Other allergic rhinitis: Secondary | ICD-10-CM | POA: Diagnosis not present

## 2021-04-18 DIAGNOSIS — J3081 Allergic rhinitis due to animal (cat) (dog) hair and dander: Secondary | ICD-10-CM | POA: Diagnosis not present

## 2021-04-18 DIAGNOSIS — J301 Allergic rhinitis due to pollen: Secondary | ICD-10-CM | POA: Diagnosis not present

## 2021-04-27 DIAGNOSIS — J3089 Other allergic rhinitis: Secondary | ICD-10-CM | POA: Diagnosis not present

## 2021-04-27 DIAGNOSIS — J301 Allergic rhinitis due to pollen: Secondary | ICD-10-CM | POA: Diagnosis not present

## 2021-05-03 DIAGNOSIS — J301 Allergic rhinitis due to pollen: Secondary | ICD-10-CM | POA: Diagnosis not present

## 2021-05-03 DIAGNOSIS — J3089 Other allergic rhinitis: Secondary | ICD-10-CM | POA: Diagnosis not present

## 2021-05-06 DIAGNOSIS — J301 Allergic rhinitis due to pollen: Secondary | ICD-10-CM | POA: Diagnosis not present

## 2021-05-06 DIAGNOSIS — J3089 Other allergic rhinitis: Secondary | ICD-10-CM | POA: Diagnosis not present

## 2021-05-06 DIAGNOSIS — J3081 Allergic rhinitis due to animal (cat) (dog) hair and dander: Secondary | ICD-10-CM | POA: Diagnosis not present

## 2021-05-10 DIAGNOSIS — J3089 Other allergic rhinitis: Secondary | ICD-10-CM | POA: Diagnosis not present

## 2021-05-10 DIAGNOSIS — J301 Allergic rhinitis due to pollen: Secondary | ICD-10-CM | POA: Diagnosis not present

## 2021-05-13 DIAGNOSIS — J301 Allergic rhinitis due to pollen: Secondary | ICD-10-CM | POA: Diagnosis not present

## 2021-05-13 DIAGNOSIS — J3089 Other allergic rhinitis: Secondary | ICD-10-CM | POA: Diagnosis not present

## 2021-05-17 DIAGNOSIS — J3089 Other allergic rhinitis: Secondary | ICD-10-CM | POA: Diagnosis not present

## 2021-05-17 DIAGNOSIS — J301 Allergic rhinitis due to pollen: Secondary | ICD-10-CM | POA: Diagnosis not present

## 2021-05-23 DIAGNOSIS — J3089 Other allergic rhinitis: Secondary | ICD-10-CM | POA: Diagnosis not present

## 2021-05-23 DIAGNOSIS — J301 Allergic rhinitis due to pollen: Secondary | ICD-10-CM | POA: Diagnosis not present

## 2021-05-23 IMAGING — MR MR CERVICAL SPINE W/O CM
4 of 5 series · 27 of 48 positions shown · non-contrast
Comparison: 04/01/2010 imaging report

CLINICAL DATA: Chronic neck pain with degenerative changes on x-ray

EXAM:
MRI CERVICAL SPINE WITHOUT CONTRAST
TECHNIQUE: Multiplanar, multisequence MR imaging of the cervical spine was
performed. No intravenous contrast was administered.

[Series 9: T2 · sagittal · 3.0mm · 0.55mm/px · 6 of 15 slices shown (1 of 2)]
[im 1/15]
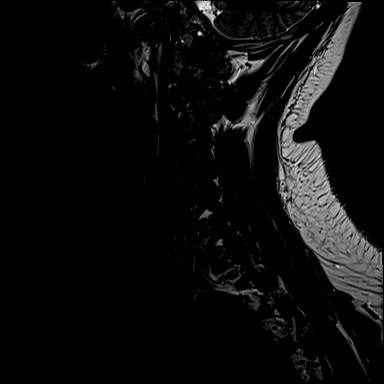
[im 3/15]
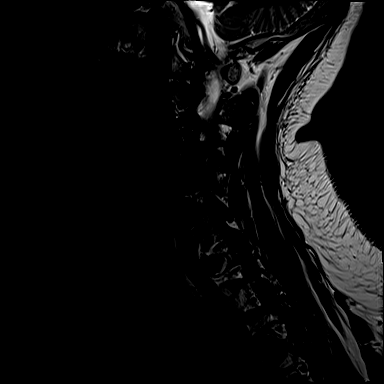
[im 6/15]
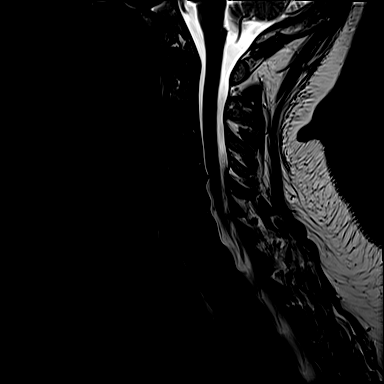
[im 9/15]
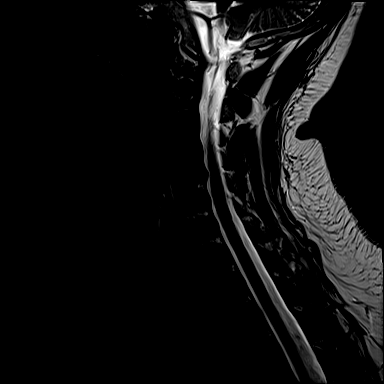
[im 12/15]
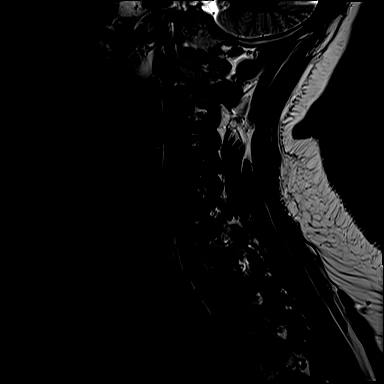
[im 15/15]
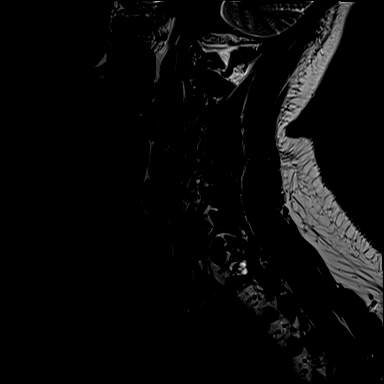

[Series 10: T1 · sagittal · 3.0mm · 0.66mm/px · 7 of 15 slices shown]
[im 1/15]
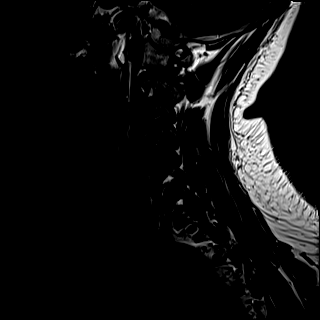
[im 3/15]
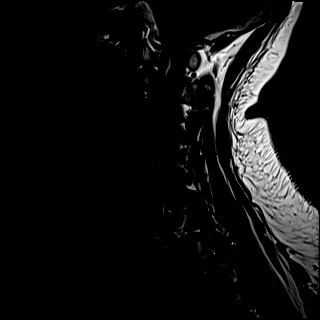
[im 5/15]
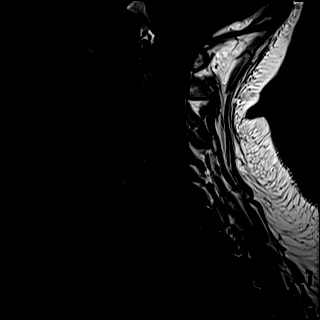
[im 8/15]
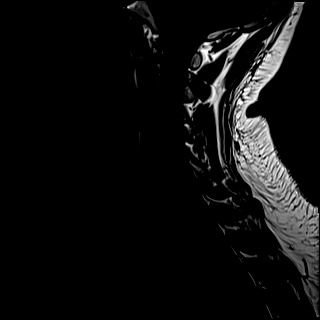
[im 10/15]
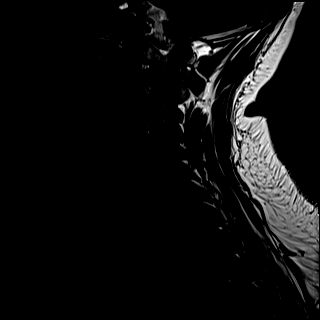
[im 12/15]
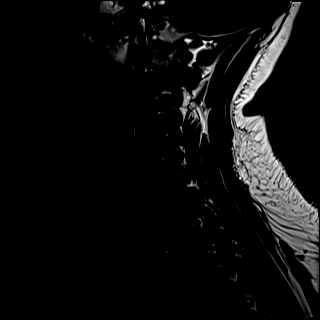
[im 15/15]
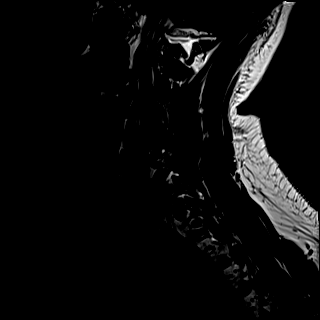

[Series 11: STIR · sagittal · 3.0mm · 0.33mm/px · 6 of 15 slices shown]
[im 1/15]
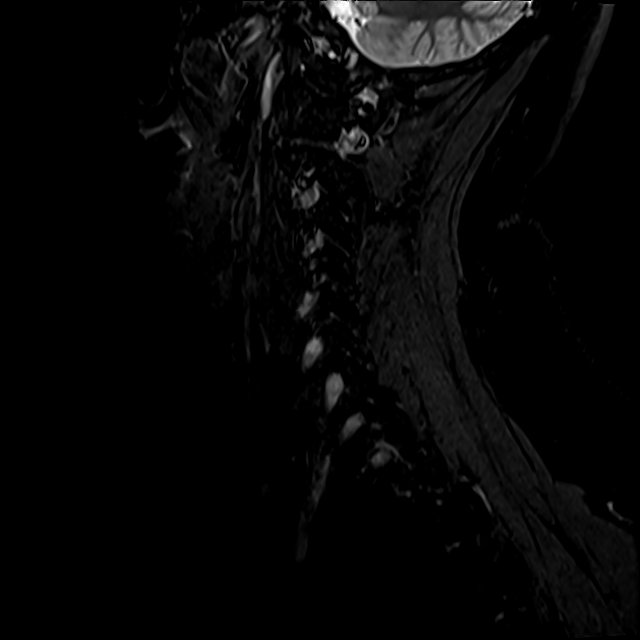
[im 3/15]
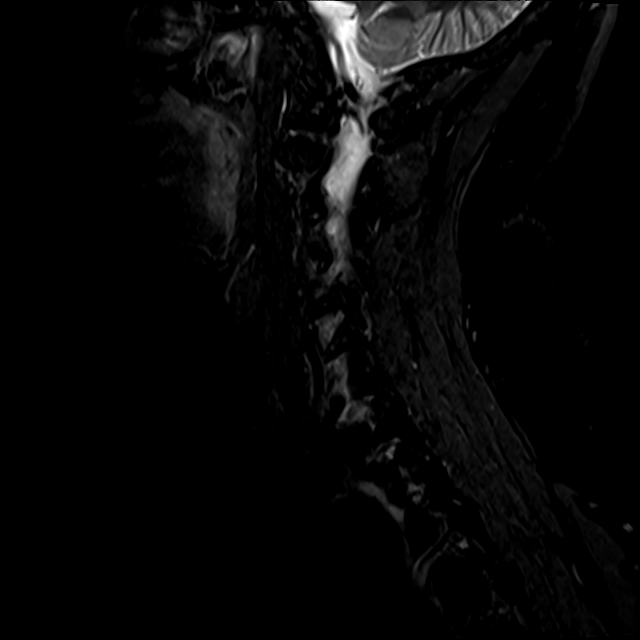
[im 5/15]
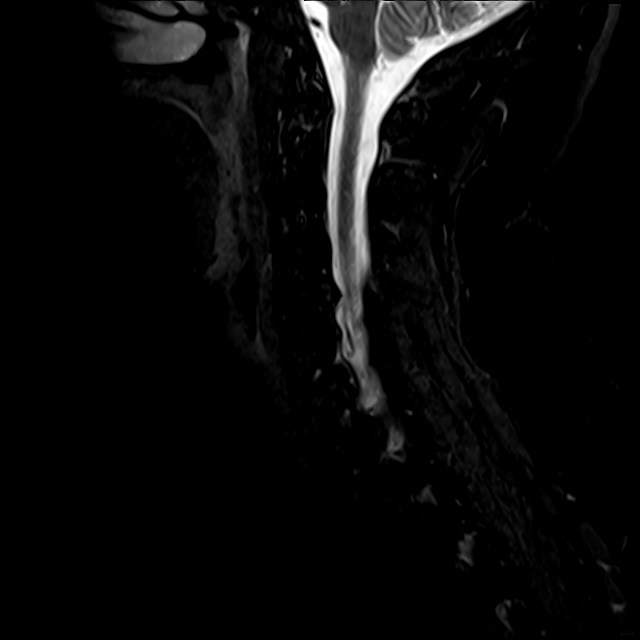
[im 8/15]
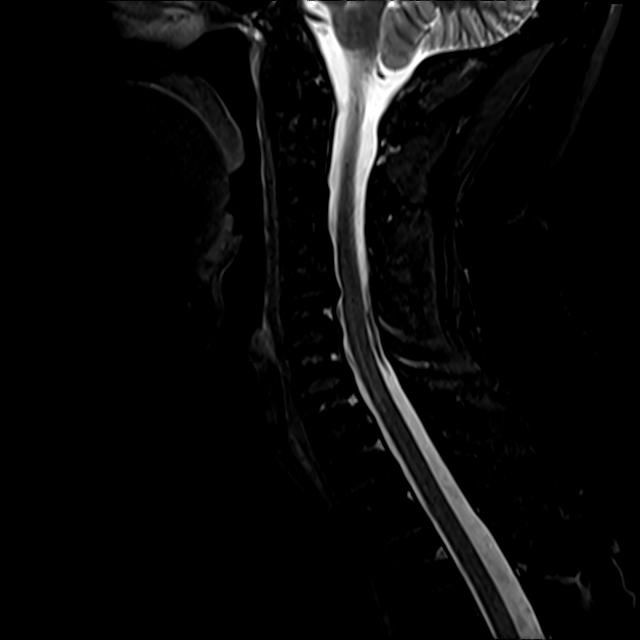
[im 10/15]
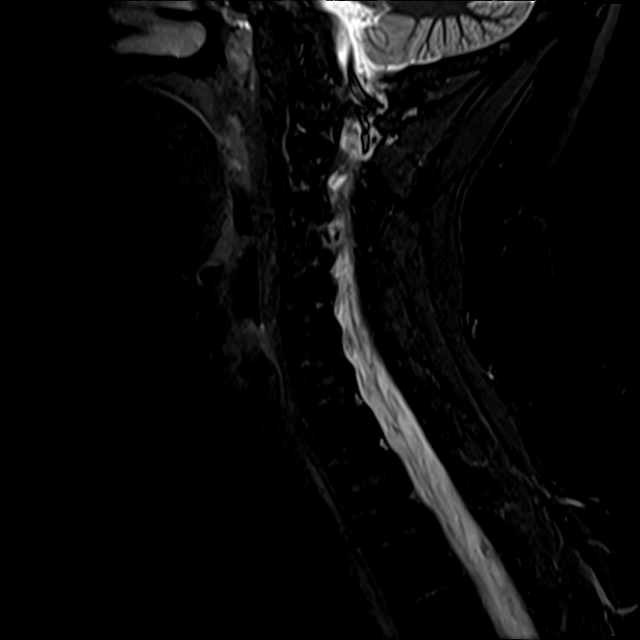
[im 12/15]
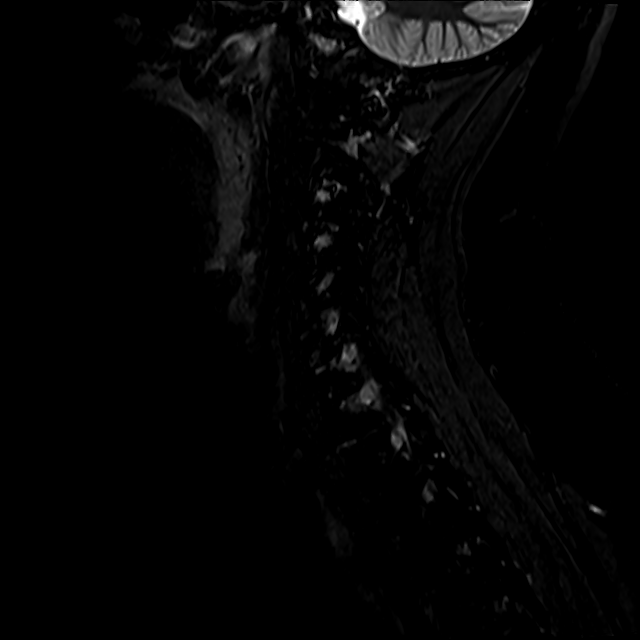

[Series 12: T2 · axial · 3.0mm · 0.50mm/px · z∈[-52,+41]mm · 8 of 30 slices shown (2 of 2)]
[im 1/30]
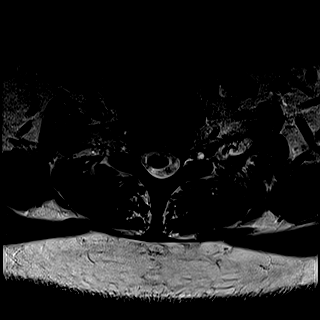
[im 5/30]
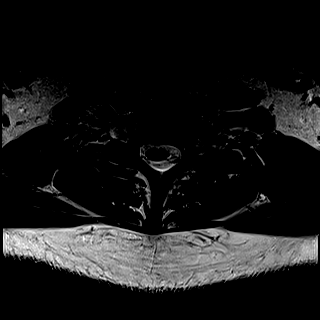
[im 9/30]
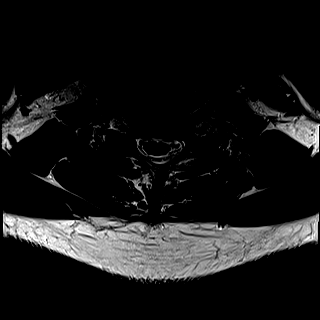
[im 14/30]
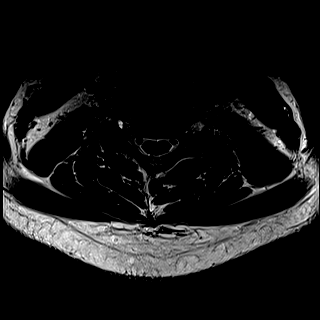
[im 16/30]
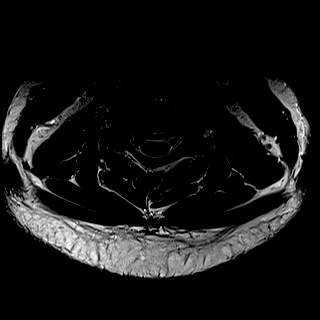
[im 21/30]
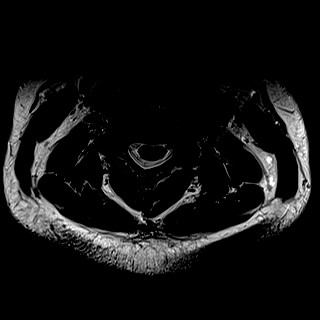
[im 25/30]
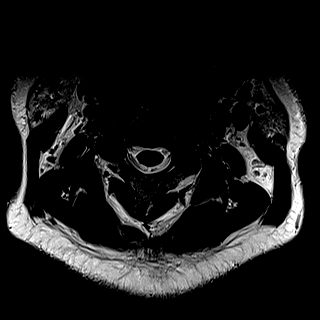
[im 30/30]
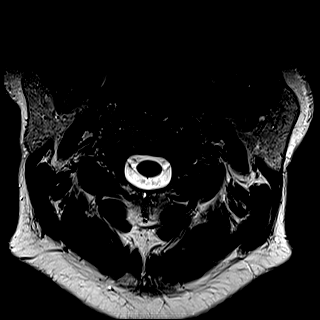

[27 of 48 positions shown; findings below may reference images not displayed]

FINDINGS: Alignment: Physiologic

Vertebrae: No fracture, evidence of discitis, or bone lesion.

Cord: Normal signal and morphology.

Posterior Fossa, vertebral arteries, paraspinal tissues: Negative.

Disc levels:

C2-3: Unremarkable.

C3-4: Unremarkable.

C4-5: Disc narrowing and right eccentric endplate and uncovertebral
ridging causing moderate right foraminal stenosis.

C5-6: Minor disc bulging.  No impingement

C6-7: Right foraminal extrusion with high-grade C7 impingement.
Patent spinal canal

C7-T1:Unremarkable.
IMPRESSION: 1. C6-7 right foraminal herniation and prominent C7 impingement.
2. C4-5 moderate right foraminal stenosis from uncovertebral
ridging.
3. Diffusely patent spinal canal.

## 2021-05-27 DIAGNOSIS — J301 Allergic rhinitis due to pollen: Secondary | ICD-10-CM | POA: Diagnosis not present

## 2021-05-27 DIAGNOSIS — J3089 Other allergic rhinitis: Secondary | ICD-10-CM | POA: Diagnosis not present

## 2021-05-31 DIAGNOSIS — J3089 Other allergic rhinitis: Secondary | ICD-10-CM | POA: Diagnosis not present

## 2021-05-31 DIAGNOSIS — J301 Allergic rhinitis due to pollen: Secondary | ICD-10-CM | POA: Diagnosis not present

## 2021-06-06 DIAGNOSIS — J301 Allergic rhinitis due to pollen: Secondary | ICD-10-CM | POA: Diagnosis not present

## 2021-06-06 DIAGNOSIS — J3089 Other allergic rhinitis: Secondary | ICD-10-CM | POA: Diagnosis not present

## 2021-06-10 DIAGNOSIS — J301 Allergic rhinitis due to pollen: Secondary | ICD-10-CM | POA: Diagnosis not present

## 2021-06-10 DIAGNOSIS — J3089 Other allergic rhinitis: Secondary | ICD-10-CM | POA: Diagnosis not present

## 2021-06-13 DIAGNOSIS — J3089 Other allergic rhinitis: Secondary | ICD-10-CM | POA: Diagnosis not present

## 2021-06-13 DIAGNOSIS — J301 Allergic rhinitis due to pollen: Secondary | ICD-10-CM | POA: Diagnosis not present

## 2021-06-16 DIAGNOSIS — H00014 Hordeolum externum left upper eyelid: Secondary | ICD-10-CM | POA: Diagnosis not present

## 2021-06-17 DIAGNOSIS — J3089 Other allergic rhinitis: Secondary | ICD-10-CM | POA: Diagnosis not present

## 2021-06-17 DIAGNOSIS — J3081 Allergic rhinitis due to animal (cat) (dog) hair and dander: Secondary | ICD-10-CM | POA: Diagnosis not present

## 2021-06-17 DIAGNOSIS — J301 Allergic rhinitis due to pollen: Secondary | ICD-10-CM | POA: Diagnosis not present

## 2021-06-21 DIAGNOSIS — J3089 Other allergic rhinitis: Secondary | ICD-10-CM | POA: Diagnosis not present

## 2021-06-21 DIAGNOSIS — J301 Allergic rhinitis due to pollen: Secondary | ICD-10-CM | POA: Diagnosis not present

## 2021-07-01 DIAGNOSIS — J3089 Other allergic rhinitis: Secondary | ICD-10-CM | POA: Diagnosis not present

## 2021-07-01 DIAGNOSIS — J301 Allergic rhinitis due to pollen: Secondary | ICD-10-CM | POA: Diagnosis not present

## 2021-07-06 DIAGNOSIS — J301 Allergic rhinitis due to pollen: Secondary | ICD-10-CM | POA: Diagnosis not present

## 2021-07-06 DIAGNOSIS — J3089 Other allergic rhinitis: Secondary | ICD-10-CM | POA: Diagnosis not present

## 2021-07-11 DIAGNOSIS — J301 Allergic rhinitis due to pollen: Secondary | ICD-10-CM | POA: Diagnosis not present

## 2021-07-11 DIAGNOSIS — J3089 Other allergic rhinitis: Secondary | ICD-10-CM | POA: Diagnosis not present

## 2021-07-13 ENCOUNTER — Ambulatory Visit (INDEPENDENT_AMBULATORY_CARE_PROVIDER_SITE_OTHER): Payer: BC Managed Care – PPO

## 2021-07-13 DIAGNOSIS — Z23 Encounter for immunization: Secondary | ICD-10-CM

## 2021-07-19 DIAGNOSIS — J301 Allergic rhinitis due to pollen: Secondary | ICD-10-CM | POA: Diagnosis not present

## 2021-07-19 DIAGNOSIS — J3089 Other allergic rhinitis: Secondary | ICD-10-CM | POA: Diagnosis not present

## 2021-07-22 DIAGNOSIS — J301 Allergic rhinitis due to pollen: Secondary | ICD-10-CM | POA: Diagnosis not present

## 2021-07-22 DIAGNOSIS — J3089 Other allergic rhinitis: Secondary | ICD-10-CM | POA: Diagnosis not present

## 2021-07-29 DIAGNOSIS — J301 Allergic rhinitis due to pollen: Secondary | ICD-10-CM | POA: Diagnosis not present

## 2021-07-29 DIAGNOSIS — J3089 Other allergic rhinitis: Secondary | ICD-10-CM | POA: Diagnosis not present

## 2021-08-05 DIAGNOSIS — J301 Allergic rhinitis due to pollen: Secondary | ICD-10-CM | POA: Diagnosis not present

## 2021-08-05 DIAGNOSIS — J3089 Other allergic rhinitis: Secondary | ICD-10-CM | POA: Diagnosis not present

## 2021-08-11 DIAGNOSIS — F4323 Adjustment disorder with mixed anxiety and depressed mood: Secondary | ICD-10-CM | POA: Diagnosis not present

## 2021-08-18 ENCOUNTER — Telehealth: Payer: Self-pay | Admitting: Family Medicine

## 2021-08-18 NOTE — Telephone Encounter (Signed)
Patient calling in with respiratory symptoms: Shortness of breath, chest pain, palpitations or other red words send to Triage  Does the patient have a fever over 100, cough, congestion, sore throat, runny nose, lost of taste/smell (please list symptoms that patient has)?sinus congestiion  What date did symptoms start?12/21 (If over 5 days ago, pt may be scheduled for in person visit)  Have you tested for Covid in the last 5 days? Yes   If yes, was it positive []  OR negative [x] ? If positive in the last 5 days, please schedule virtual visit now. If negative, schedule for an in person OV with the next available provider if PCP has no openings. Please also let patient know they will be tested again (follow the script below)  "you will have to arrive prior to your appt time to be Covid tested. Please park in back of office at the cone & call [phone] to let the staff know you have arrived. A staff member will meet you at your car to do a rapid covid test. Once the test has resulted you will be notified by phone of your results to determine if appt will remain an in person visit or be converted to a virtual/phone visit. If you arrive less than before your appt time, your visit will be automatically converted to virtual & any recommended testing will happen AFTER the visit.  Pt have a office visit 12/30/ with dr.Fry THINGS TO REMEMBER  If no availability for virtual visit in office,  please schedule another [place] office  If no availability at another [place] office, please instruct patient that they can schedule an evisit or virtual visit through their mychart account. Visits up to 8pm  patients can be seen in office 5 days after positive COVID test

## 2021-08-19 ENCOUNTER — Encounter: Payer: Self-pay | Admitting: Family Medicine

## 2021-08-19 ENCOUNTER — Ambulatory Visit: Payer: BC Managed Care – PPO | Admitting: Family Medicine

## 2021-08-19 VITALS — BP 118/80 | HR 78 | Temp 98.6°F | Wt 235.0 lb

## 2021-08-19 DIAGNOSIS — J0191 Acute recurrent sinusitis, unspecified: Secondary | ICD-10-CM | POA: Diagnosis not present

## 2021-08-19 DIAGNOSIS — J3089 Other allergic rhinitis: Secondary | ICD-10-CM | POA: Diagnosis not present

## 2021-08-19 DIAGNOSIS — J301 Allergic rhinitis due to pollen: Secondary | ICD-10-CM | POA: Diagnosis not present

## 2021-08-19 MED ORDER — METHYLPREDNISOLONE ACETATE 40 MG/ML IJ SUSP
40.0000 mg | Freq: Once | INTRAMUSCULAR | Status: AC
  Administered 2021-08-19: 15:00:00 40 mg via INTRAMUSCULAR

## 2021-08-19 MED ORDER — METHYLPREDNISOLONE ACETATE 80 MG/ML IJ SUSP
80.0000 mg | Freq: Once | INTRAMUSCULAR | Status: AC
  Administered 2021-08-19: 15:00:00 80 mg via INTRAMUSCULAR

## 2021-08-19 MED ORDER — AMOXICILLIN-POT CLAVULANATE 875-125 MG PO TABS: 1.0000 | ORAL_TABLET | Freq: Two times a day (BID) | ORAL | 0 refills | Status: DC

## 2021-08-19 NOTE — Addendum Note (Signed)
Addended by: Carola Rhine on: 08/19/2021 03:08 PM   Modules accepted: Orders

## 2021-08-19 NOTE — Progress Notes (Signed)
° °  Subjective:    Patient ID: Isaac [name], male    DOB: 25-Feb-1976, 45 y.o.   MRN: [id number]  HPI Here for 8 days of sinus pressure, PND, ST and a dry cough. No fever or body aches. Using Nyquil and Mucinex D.   Review of Systems  Constitutional: Negative.   HENT:  Positive for congestion, postnasal drip and sore throat.   Eyes:  Negative for visual disturbance.  Respiratory:  Positive for cough. Negative for chest tightness, shortness of breath and wheezing.       Objective:   Physical Exam Constitutional:      Appearance: Normal appearance. He is not ill-appearing.  HENT:     Right Ear: Tympanic membrane, ear canal and external ear normal.     Left Ear: Tympanic membrane, ear canal and external ear normal.     Nose: Nose normal.     Mouth/Throat:     Pharynx: Oropharynx is clear.  Eyes:     Conjunctiva/sclera: Conjunctivae normal.  Pulmonary:     Effort: Pulmonary effort is normal.     Breath sounds: Normal breath sounds.  Lymphadenopathy:     Cervical: No cervical adenopathy.  Neurological:     Mental Status: He is alert.          Assessment & Plan:  Sinusitis, treat with Augmentin. Given a shot of DepoMedrol.  Gershon Crane, MD

## 2021-08-21 DIAGNOSIS — R7309 Other abnormal glucose: Secondary | ICD-10-CM | POA: Diagnosis not present

## 2021-08-26 ENCOUNTER — Other Ambulatory Visit: Payer: Self-pay | Admitting: Family Medicine

## 2021-09-14 ENCOUNTER — Encounter: Payer: Self-pay | Admitting: Family Medicine

## 2021-09-14 ENCOUNTER — Ambulatory Visit: Payer: BC Managed Care – PPO | Admitting: Family Medicine

## 2021-09-14 VITALS — BP 128/80 | HR 73 | Temp 98.5°F | Wt 235.0 lb

## 2021-09-14 DIAGNOSIS — J3089 Other allergic rhinitis: Secondary | ICD-10-CM | POA: Diagnosis not present

## 2021-09-14 DIAGNOSIS — J0191 Acute recurrent sinusitis, unspecified: Secondary | ICD-10-CM

## 2021-09-14 DIAGNOSIS — J301 Allergic rhinitis due to pollen: Secondary | ICD-10-CM | POA: Diagnosis not present

## 2021-09-14 MED ORDER — LEVOFLOXACIN 500 MG PO TABS: 500.0000 mg | ORAL_TABLET | Freq: Every day | ORAL | 0 refills | Status: AC

## 2021-09-14 NOTE — Progress Notes (Signed)
° °  Subjective:    Patient ID: Isaac [name], male    DOB: 07/17/1976, 46 y.o.   MRN: [id number]  HPI Here for continued sinus congestion, PND, and dry cough. This started about 6 weeks ago. We saw him on 08-19-21 and we gave him 10 days of Augmentin and a steroid taper pack. This helped for about a week, and then the symptoms returned. He is using Mucinex, Claritin, Delsym, and Ibuprofen.    Review of Systems  Constitutional: Negative.   HENT:  Positive for congestion, postnasal drip and sinus pressure. Negative for sore throat.   Eyes: Negative.   Respiratory:  Positive for cough.       Objective:   Physical Exam Constitutional:      Appearance: Normal appearance. He is not ill-appearing.  HENT:     Right Ear: Tympanic membrane, ear canal and external ear normal.     Left Ear: Tympanic membrane, ear canal and external ear normal.     Nose: Nose normal.     Mouth/Throat:     Pharynx: Oropharynx is clear.  Eyes:     Conjunctiva/sclera: Conjunctivae normal.  Pulmonary:     Effort: Pulmonary effort is normal.     Breath sounds: Normal breath sounds.  Lymphadenopathy:     Cervical: No cervical adenopathy.  Neurological:     Mental Status: He is alert.          Assessment & Plan:  Recurrent sinusitis, treat with 10 days of Levaquin.  Gershon Crane, MD

## 2021-09-14 NOTE — Addendum Note (Signed)
Addended by: Gershon Crane A on: 09/14/2021 03:32 PM   Modules accepted: Orders

## 2021-09-21 DIAGNOSIS — R7309 Other abnormal glucose: Secondary | ICD-10-CM | POA: Diagnosis not present

## 2021-09-21 DIAGNOSIS — J3089 Other allergic rhinitis: Secondary | ICD-10-CM | POA: Diagnosis not present

## 2021-09-21 DIAGNOSIS — J301 Allergic rhinitis due to pollen: Secondary | ICD-10-CM | POA: Diagnosis not present

## 2021-09-28 DIAGNOSIS — J301 Allergic rhinitis due to pollen: Secondary | ICD-10-CM | POA: Diagnosis not present

## 2021-09-28 DIAGNOSIS — J3089 Other allergic rhinitis: Secondary | ICD-10-CM | POA: Diagnosis not present

## 2021-09-29 DIAGNOSIS — J301 Allergic rhinitis due to pollen: Secondary | ICD-10-CM | POA: Diagnosis not present

## 2021-09-30 DIAGNOSIS — J3089 Other allergic rhinitis: Secondary | ICD-10-CM | POA: Diagnosis not present

## 2021-10-11 DIAGNOSIS — J301 Allergic rhinitis due to pollen: Secondary | ICD-10-CM | POA: Diagnosis not present

## 2021-10-11 DIAGNOSIS — J3081 Allergic rhinitis due to animal (cat) (dog) hair and dander: Secondary | ICD-10-CM | POA: Diagnosis not present

## 2021-10-11 DIAGNOSIS — J3089 Other allergic rhinitis: Secondary | ICD-10-CM | POA: Diagnosis not present

## 2021-10-17 DIAGNOSIS — J3089 Other allergic rhinitis: Secondary | ICD-10-CM | POA: Diagnosis not present

## 2021-10-17 DIAGNOSIS — J301 Allergic rhinitis due to pollen: Secondary | ICD-10-CM | POA: Diagnosis not present

## 2021-10-17 DIAGNOSIS — M65872 Other synovitis and tenosynovitis, left ankle and foot: Secondary | ICD-10-CM | POA: Diagnosis not present

## 2021-10-17 DIAGNOSIS — M205X2 Other deformities of toe(s) (acquired), left foot: Secondary | ICD-10-CM | POA: Diagnosis not present

## 2021-10-19 DIAGNOSIS — R7309 Other abnormal glucose: Secondary | ICD-10-CM | POA: Diagnosis not present

## 2021-10-24 DIAGNOSIS — M65872 Other synovitis and tenosynovitis, left ankle and foot: Secondary | ICD-10-CM | POA: Diagnosis not present

## 2021-11-04 ENCOUNTER — Encounter: Payer: Self-pay | Admitting: Family Medicine

## 2021-11-04 ENCOUNTER — Ambulatory Visit: Payer: BC Managed Care – PPO | Admitting: Family Medicine

## 2021-11-04 VITALS — BP 118/80 | HR 62 | Temp 98.2°F | Wt 216.0 lb

## 2021-11-04 DIAGNOSIS — R002 Palpitations: Secondary | ICD-10-CM | POA: Diagnosis not present

## 2021-11-04 NOTE — Progress Notes (Signed)
? ?  Subjective:  ? ? Patient ID: Isaac [name], male    DOB: 1976-06-03, 46 y.o.   MRN: [id number] ? ?HPI ?Here for more bouts of palpitations. We spoke of this a year ago when he had a few minor bouts. He went for months with nothing and then he had a short minor bout 2 weeks ago. He then had a longer and more intense bout last night. This woke him up from sleep, and he describes feeling his heart beat very irregularly and somewhat faster than normal. He had some slight chest pressure during this bout, but no real pain or SOB or lightheadedness. No sweats or nausea. This last ed about 30 minutes and then stopped. Since then he has felt slightly weak but has had no more palpitations. He notes his mother has atrial fibrillation.  ? ? ?Review of Systems  ?Constitutional: Negative.   ?Respiratory: Negative.    ?Cardiovascular:  Positive for palpitations. Negative for chest pain and leg swelling.  ?Gastrointestinal: Negative.   ?Neurological: Negative.   ? ?   ?Objective:  ? Physical Exam ?Constitutional:   ?   Appearance: Normal appearance. He is not ill-appearing.  ?Cardiovascular:  ?   Rate and Rhythm: Normal rate and regular rhythm.  ?   Pulses: Normal pulses.  ?   Heart sounds: Normal heart sounds.  ?   Comments: EKG today shows normal sinus rhythm  ?Pulmonary:  ?   Effort: Pulmonary effort is normal.  ?   Breath sounds: Normal breath sounds.  ?Neurological:  ?   Mental Status: He is alert.  ? ? ? ? ? ?   ?Assessment & Plan:  ?He has intermittent bouts of irregular heartbeats suspicious for paroxysmal atrial fibrillation. I asked him to start taking ASA 81 mg daily. We will refer him asap to Cardiology to evaluate further. Right now his BP and heart rate are stable. We will send him for a TSH, CBC, and BMET today. We spent a total of ( 35  ) minutes reviewing records and discussing these issues.  ?Alysia Penna, MD ? ? ? ?

## 2021-11-05 LAB — BASIC METABOLIC PANEL
BUN: 22 mg/dL (ref 7–25)
CO2: 28 mmol/L (ref 20–32)
Calcium: 10 mg/dL (ref 8.6–10.3)
Chloride: 102 mmol/L (ref 98–110)
Creat: 1.11 mg/dL (ref 0.60–1.29)
Glucose, Bld: 88 mg/dL (ref 65–99)
Potassium: 4.6 mmol/L (ref 3.5–5.3)
Sodium: 141 mmol/L (ref 135–146)

## 2021-11-05 LAB — CBC WITH DIFFERENTIAL/PLATELET
Absolute Monocytes: 718 cells/uL (ref 200–950)
Basophils Absolute: 59 cells/uL (ref 0–200)
Basophils Relative: 0.8 %
Eosinophils Absolute: 163 cells/uL (ref 15–500)
Eosinophils Relative: 2.2 %
HCT: 48.9 % (ref 38.5–50.0)
Hemoglobin: 16.4 g/dL (ref 13.2–17.1)
Lymphs Abs: 1791 cells/uL (ref 850–3900)
MCH: 29.6 pg (ref 27.0–33.0)
MCHC: 33.5 g/dL (ref 32.0–36.0)
MCV: 88.3 fL (ref 80.0–100.0)
MPV: 10.3 fL (ref 7.5–12.5)
Monocytes Relative: 9.7 %
Neutro Abs: 4669 cells/uL (ref 1500–7800)
Neutrophils Relative %: 63.1 %
Platelets: 296 10*3/uL (ref 140–400)
RBC: 5.54 10*6/uL (ref 4.20–5.80)
RDW: 12.2 % (ref 11.0–15.0)
Total Lymphocyte: 24.2 %
WBC: 7.4 10*3/uL (ref 3.8–10.8)

## 2021-11-05 LAB — TSH: TSH: 2.16 mIU/L (ref 0.40–4.50)

## 2021-11-07 ENCOUNTER — Encounter: Payer: Self-pay | Admitting: Family Medicine

## 2021-11-07 DIAGNOSIS — R002 Palpitations: Secondary | ICD-10-CM

## 2021-11-07 NOTE — Telephone Encounter (Signed)
I did the referral 

## 2021-11-09 ENCOUNTER — Ambulatory Visit: Payer: BC Managed Care – PPO | Admitting: Cardiovascular Disease

## 2021-11-09 NOTE — Progress Notes (Deleted)
?Cardiology Office Note:   ?Date:  11/09/2021  ?NAME:  Isaac [name]    ?MRN: [id number] ?DOB:  1976/08/04  ? ?PCP:  Laurey Morale, MD  ?Cardiologist:  None  ?Electrophysiologist:  None  ? ?Referring MD: Laurey Morale, MD  ? ?No chief complaint on file. ?*** ? ?History of Present Illness:   ?Isaac [name] is a 46 y.o. male with a hx of kidney stones who is being seen today for the evaluation of palpitations at the request of Laurey Morale, MD. ? ?Past Medical History: ?Past Medical History:  ?Diagnosis Date  ? Chronic dryness of both eyes   ? sees Dr. Nicki Reaper   ? Kidney stones   ? sees Dr. Orma Render   ? ? ?Past Surgical History: ?Past Surgical History:  ?Procedure Laterality Date  ? epidural steroid shot    ? upper and lower spine   ? kidney stones    ? 10 years ago  ? KNEE ARTHROSCOPY WITH MENISCAL REPAIR Left   ? SPINE SURGERY    ? injections  ? ? ?Current Medications: ?No outpatient medications have been marked as taking for the 11/09/21 encounter (Appointment) with O'Neal, Cassie Freer, MD.  ?  ? ?Allergies:    ?Codeine  ? ?Social History: ?Social History  ? ?Socioeconomic History  ? Marital status: Married  ?  Spouse name: Not on file  ? Number of children: Not on file  ? Years of education: Not on file  ? Highest education level: Not on file  ?Occupational History  ? Not on file  ?Tobacco Use  ? Smoking status: Some Days  ?  Types: Cigars  ? Smokeless tobacco: Never  ? Tobacco comments:  ?  occ  ?Substance and Sexual Activity  ? Alcohol use: Yes  ?  Alcohol/week: 0.0 standard drinks  ?  Comment: occ beer  ? Drug use: No  ? Sexual activity: Not on file  ?Other Topics Concern  ? Not on file  ?Social History Narrative  ? Not on file  ? ?Social Determinants of Health  ? ?Financial Resource Strain: Not on file  ?Food Insecurity: Not on file  ?Transportation Needs: Not on file  ?Physical Activity: Not on file  ?Stress: Not on file  ?Social Connections: Not on file  ?  ? ?Family History: ?The patient's ***family  history includes Breast cancer in his mother; Cancer in his maternal grandfather, mother, and paternal grandfather; Stroke in his mother. ? ?ROS:   ?All other ROS reviewed and negative. Pertinent positives noted in the HPI.    ? ?EKGs/Labs/Other Studies Reviewed:   ?The following studies were personally reviewed by me today: ? ?EKG:  EKG is *** ordered today.  The ekg ordered today demonstrates ***, and was personally reviewed by me.  ? ?Recent Labs: ?11/04/2021: BUN 22; Creat 1.11; Hemoglobin 16.4; Platelets 296; Potassium 4.6; Sodium 141; TSH 2.16  ? ?Recent Lipid Panel ?   ?Component Value Date/Time  ? CHOL 229 (H) 04/30/2017 0840  ? TRIG 167.0 (H) 04/30/2017 0840  ? HDL 47.20 04/30/2017 0840  ? CHOLHDL 5 04/30/2017 0840  ? VLDL 33.4 04/30/2017 0840  ? [place] 149 (H) 04/30/2017 0840  ? ? ?Physical Exam:   ?VS:  There were no vitals taken for this visit.   ?Wt Readings from Last 3 Encounters:  ?11/04/21 216 lb (98 kg)  ?09/14/21 235 lb (106.6 kg)  ?08/19/21 235 lb (106.6 kg)  ?  ?General: Well nourished, well developed, in  no acute distress ?Head: Atraumatic, normal size  ?Eyes: PEERLA, EOMI  ?Neck: Supple, no JVD ?Endocrine: No thryomegaly ?Cardiac: Normal S1, S2; RRR; no murmurs, rubs, or gallops ?Lungs: Clear to auscultation bilaterally, no wheezing, rhonchi or rales  ?Abd: Soft, nontender, no hepatomegaly  ?Ext: No edema, pulses 2+ ?Musculoskeletal: No deformities, BUE and BLE strength normal and equal ?Skin: Warm and dry, no rashes   ?Neuro: Alert and oriented to person, place, time, and situation, CNII-XII grossly intact, no focal deficits  ?Psych: Normal mood and affect  ? ?ASSESSMENT:   ?Isaac [name] is a 46 y.o. male who presents for the following: ?No diagnosis found. ? ?PLAN:   ?There are no diagnoses linked to this encounter. ? ?{Are you ordering a CV Procedure (e.g. stress test, cath, DCCV, TEE, etc)?   Press F2        :[id number] ? ?Disposition: No follow-ups on file. ? ?Medication Adjustments/Labs  and Tests Ordered: ?Current medicines are reviewed at length with the patient today.  Concerns regarding medicines are outlined above.  ?No orders of the defined types were placed in this encounter. ? ?No orders of the defined types were placed in this encounter. ? ? ?There are no Patient Instructions on file for this visit.  ? ?Time Spent with Patient: I have spent a total of *** minutes with patient reviewing hospital notes, telemetry, EKGs, labs and examining the patient as well as establishing an assessment and plan that was discussed with the patient.  > 50% of time was spent in direct patient care. ? ?Signed, ?[place] B[place] T. Audie Box, MD, [facility] ?[place]  ?[address] ?[place], [place] [address] ?[phone]  ?11/09/2021 7:11 AM    ? ?

## 2021-11-19 DIAGNOSIS — R7309 Other abnormal glucose: Secondary | ICD-10-CM | POA: Diagnosis not present

## 2021-11-20 NOTE — Progress Notes (Signed)
?Cardiology Office Note:   ?Date:  11/22/2021  ?NAME:  Isaac [name]    ?MRN: [id number] ?DOB:  07/31/1976  ? ?PCP:  Nelwyn Salisbury, MD  ?Cardiologist:  None  ?Electrophysiologist:  None  ? ?Referring MD: Nelwyn Salisbury, MD  ? ?Chief Complaint  ?Patient presents with  ? Palpitations  ? ?History of Present Illness:   ?Isaac [name] is a 46 y.o. male with a hx of kidney stones who is being seen today for the evaluation of palpitations at the request of Nelwyn Salisbury, MD. he reports for the last year he has had episodes of palpitations.  They occur every few months.  Apparently he had an episode in March that was alarming.  He describes skipped and irregular heartbeats that mainly occur at night.  Symptoms last 15 to 20 minutes.  Apparently episode on March 17 lasted up to 45 minutes.  He reports no trigger.  Possibly stress related.  He reports his symptoms resolve with time.  He has never had a heart attack or stroke.  His EKG demonstrates an incomplete right bundle branch block which is acceptable for his age.  He has no risk factors.  No high blood pressure.  He is not diabetic.  He has never had a heart attack or stroke.  His mother has atrial fibrillation.  He uses cigars occasionally.  No alcohol or drug use is reported.  He is married with 1 child.  No regular exercise.  Describes occasional tightness in his chest.  This is not bothersome.  He is more concerned about the rapid heartbeat sensation.  He does suffer from acid reflux as well.  Recent thyroid studies are normal.  Lab work also normal.  He is currently not having any symptoms.  Cardiovascular examination is normal. ? ?TSH 2.16 ? ?Past Medical History: ?Past Medical History:  ?Diagnosis Date  ? Chronic dryness of both eyes   ? sees Dr. Lorin Picket   ? Kidney stones   ? sees Dr. Magdalen Spatz   ? ? ?Past Surgical History: ?Past Surgical History:  ?Procedure Laterality Date  ? epidural steroid shot    ? upper and lower spine   ? kidney stones    ? 10 years ago   ? KNEE ARTHROSCOPY WITH MENISCAL REPAIR Left   ? SPINE SURGERY    ? injections  ? ? ?Current Medications: ?Current Meds  ?Medication Sig  ? aspirin 81 MG EC tablet   ? Levocetirizine Dihydrochloride (XYZAL PO) Take 10 mg by mouth as needed.  ? Multiple Vitamin (MULTIVITAMIN) tablet Take 1 tablet by mouth.  ? omeprazole (PRILOSEC) 20 MG capsule TAKE 1 CAPSULE BY MOUTH EVERY DAY  ?  ? ?Allergies:    ?Codeine  ? ?Social History: ?Social History  ? ?Socioeconomic History  ? Marital status: Married  ?  Spouse name: Not on file  ? Number of children: 1  ? Years of education: Not on file  ? Highest education level: Not on file  ?Occupational History  ? Occupation: Tech  ?Tobacco Use  ? Smoking status: Some Days  ?  Types: Cigars  ? Smokeless tobacco: Never  ? Tobacco comments:  ?  occ  ?Substance and Sexual Activity  ? Alcohol use: Yes  ?  Alcohol/week: 0.0 standard drinks  ?  Comment: occ beer  ? Drug use: No  ? Sexual activity: Not on file  ?Other Topics Concern  ? Not on file  ?Social History Narrative  ? Not  on file  ? ?Social Determinants of Health  ? ?Financial Resource Strain: Not on file  ?Food Insecurity: Not on file  ?Transportation Needs: Not on file  ?Physical Activity: Not on file  ?Stress: Not on file  ?Social Connections: Not on file  ?  ? ?Family History: ?The patient's family history includes Arrhythmia in his mother; Breast cancer in his mother; Cancer in his maternal grandfather, mother, and paternal grandfather; Stroke in his mother. ? ?ROS:   ?All other ROS reviewed and negative. Pertinent positives noted in the HPI.    ? ?EKGs/Labs/Other Studies Reviewed:   ?The following studies were personally reviewed by me today: ? ?EKG:  EKG is ordered today.  The ekg ordered today demonstrates NSR 74 bpm, IRBBB, and was personally reviewed by me.  ? ?Recent Labs: ?11/04/2021: BUN 22; Creat 1.11; Hemoglobin 16.4; Platelets 296; Potassium 4.6; Sodium 141; TSH 2.16  ? ?Recent Lipid Panel ?   ?Component Value  Date/Time  ? CHOL 229 (H) 04/30/2017 0840  ? TRIG 167.0 (H) 04/30/2017 0840  ? HDL 47.20 04/30/2017 0840  ? CHOLHDL 5 04/30/2017 0840  ? VLDL 33.4 04/30/2017 0840  ? LDLCALC 149 (H) 04/30/2017 0840  ? ? ?Physical Exam:   ?VS:  BP 117/77   Pulse 74   Ht 6\' 1"  (1.854 m)   Wt 219 lb 12.8 oz (99.7 kg)   SpO2 97%   BMI 29.00 kg/m?    ?Wt Readings from Last 3 Encounters:  ?11/22/21 219 lb 12.8 oz (99.7 kg)  ?11/04/21 216 lb (98 kg)  ?09/14/21 235 lb (106.6 kg)  ?  ?General: Well nourished, well developed, in no acute distress ?Head: Atraumatic, normal size  ?Eyes: PEERLA, EOMI  ?Neck: Supple, no JVD ?Endocrine: No thryomegaly ?Cardiac: Normal S1, S2; RRR; no murmurs, rubs, or gallops ?Lungs: Clear to auscultation bilaterally, no wheezing, rhonchi or rales  ?Abd: Soft, nontender, no hepatomegaly  ?Ext: No edema, pulses 2+ ?Musculoskeletal: No deformities, BUE and BLE strength normal and equal ?Skin: Warm and dry, no rashes   ?Neuro: Alert and oriented to person, place, time, and situation, CNII-XII grossly intact, no focal deficits  ?Psych: Normal mood and affect  ? ?ASSESSMENT:   ?Isaac [name] is a 46 y.o. male who presents for the following: ?1. Palpitations   ? ? ?PLAN:   ?1. Palpitations ?-Palpitations for over a year.  Episodes occur every few months.  EKG in office shows sinus rhythm with an incomplete right bundle branch block.  This is within limits for his age.  Recent thyroid study is normal.  He is not anemic.  His cardiovascular examination is very normal today.  Really unclear what these episodes represent.  He does describe some stress.  Also describes tightness in his chest which he attributes to acid reflux.  Given the infrequency of symptoms a monitor is of low yield.  He also is no longer having symptoms.  I have recommended he purchase a cardia mobile device or a Apple Watch with EKG capabilities.  This will allow him to monitor his rhythm.  He will send 49 strips after he obtains his device.  He  will see [place] back as needed based on what he finds on ambulatory monitoring which he can do himself. ? ?Disposition: Return if symptoms worsen or fail to improve. ? ?Medication Adjustments/Labs and Tests Ordered: ?Current medicines are reviewed at length with the patient today.  Concerns regarding medicines are outlined above.  ?Orders Placed This Encounter  ?Procedures  ?  EKG 12-Lead  ? ?No orders of the defined types were placed in this encounter. ? ? ?Patient Instructions  ?Medication Instructions:  ?The current medical regimen is effective;  continue present plan and medications. ? ?*If you need a refill on your cardiac medications before your next appointment, please call your pharmacy* ? ?Follow-Up: ?At [facility], you and your health needs are our priority.  As part of our continuing mission to provide you with exceptional heart care, we have created designated Provider Care Teams.  These Care Teams include your primary Cardiologist (physician) and Advanced Practice Providers (APPs -  Physician Assistants and Nurse Practitioners) who all work together to provide you with the care you need, when you need it. ? ?We recommend signing up for the patient portal called "MyChart".  Sign up information is provided on this After Visit Summary.  MyChart is used to connect with patients for Virtual Visits (Telemedicine).  Patients are able to view lab/test results, encounter notes, upcoming appointments, etc.  Non-urgent messages can be sent to your provider as well.   ?To learn more about what you can do with MyChart, go to [email or web address].   ? ?Your next appointment:   ?As needed ? ?The format for your next appointment:   ?In Person ? ?Provider:   ?Lennie OdorWesley O'Neal, MD  ? ? ?Other Instructions ?Kardia Mobile device-  ?  ? ? ?Signed, ?Gerri SporeWesley T. Flora Lipps'Neal, MD, [facility] ?[place]  [facility]  ?[address], [address] ?[place], [place] [address] ?[phone]  ?11/22/2021 9:55 AM    ? ?

## 2021-11-22 ENCOUNTER — Encounter: Payer: Self-pay | Admitting: Cardiovascular Disease

## 2021-11-22 ENCOUNTER — Ambulatory Visit: Payer: BC Managed Care – PPO | Admitting: Cardiovascular Disease

## 2021-11-22 VITALS — BP 117/77 | HR 74 | Ht 73.0 in | Wt 219.8 lb

## 2021-11-22 DIAGNOSIS — R002 Palpitations: Secondary | ICD-10-CM | POA: Diagnosis not present

## 2021-11-22 NOTE — Patient Instructions (Signed)
Medication Instructions:  ?The current medical regimen is effective;  continue present plan and medications. ? ?*If you need a refill on your cardiac medications before your next appointment, please call your pharmacy* ? ?Follow-Up: ?At [facility], you and your health needs are our priority.  As part of our continuing mission to provide you with exceptional heart care, we have created designated Provider Care Teams.  These Care Teams include your primary Cardiologist (physician) and Advanced Practice Providers (APPs -  Physician Assistants and Nurse Practitioners) who all work together to provide you with the care you need, when you need it. ? ?We recommend signing up for the patient portal called "MyChart".  Sign up information is provided on this After Visit Summary.  MyChart is used to connect with patients for Virtual Visits (Telemedicine).  Patients are able to view lab/test results, encounter notes, upcoming appointments, etc.  Non-urgent messages can be sent to your provider as well.   ?To learn more about what you can do with MyChart, go to [email or web address].   ? ?Your next appointment:   ?As needed ? ?The format for your next appointment:   ?In Person ? ?Provider:   ?Lennie Odor, MD  ? ? ?Other Instructions ?Kardia Mobile device-  ? ?

## 2021-11-24 ENCOUNTER — Other Ambulatory Visit: Payer: Self-pay | Admitting: Family Medicine

## 2021-12-09 DIAGNOSIS — J3089 Other allergic rhinitis: Secondary | ICD-10-CM | POA: Diagnosis not present

## 2021-12-09 DIAGNOSIS — J301 Allergic rhinitis due to pollen: Secondary | ICD-10-CM | POA: Diagnosis not present

## 2021-12-13 DIAGNOSIS — J301 Allergic rhinitis due to pollen: Secondary | ICD-10-CM | POA: Diagnosis not present

## 2021-12-13 DIAGNOSIS — J3089 Other allergic rhinitis: Secondary | ICD-10-CM | POA: Diagnosis not present

## 2021-12-13 DIAGNOSIS — R052 Subacute cough: Secondary | ICD-10-CM | POA: Diagnosis not present

## 2021-12-14 DIAGNOSIS — D1801 Hemangioma of skin and subcutaneous tissue: Secondary | ICD-10-CM | POA: Diagnosis not present

## 2021-12-19 DIAGNOSIS — R7309 Other abnormal glucose: Secondary | ICD-10-CM | POA: Diagnosis not present

## 2021-12-28 DIAGNOSIS — H00021 Hordeolum internum right upper eyelid: Secondary | ICD-10-CM | POA: Diagnosis not present

## 2022-01-06 ENCOUNTER — Encounter: Payer: Self-pay | Admitting: Cardiovascular Disease

## 2022-01-10 NOTE — Progress Notes (Unsigned)
Cardiology Office Note:   Date:  01/11/2022  NAME:  ARSHAWN [name]    MRN: [id number] DOB:  Aug 01, 1976   PCP:  Nelwyn Salisbury, MD  Cardiologist:  None  Electrophysiologist:  None   Referring MD: Nelwyn Salisbury, MD   Chief Complaint  Patient presents with   Follow-up   History of Present Illness:   Isaac [name] is a 46 y.o. male with a hx of pAF who presents for follow-up. pAF captured on kardia mobile.  He has had 2 episodes of atrial fibrillation captured on his cardia mobile device.  Reports chest discomfort with the episodes.  They are short-lived.  Longest episode up to 5 minutes.  EKG shows sinus rhythm today.  He does have clear documentation of atrial fibrillation.  CHA2DS2-VASc score equals 0.  Does not need anticoagulation.  Recent lab work including TSH was normal.  Cardiovascular examination normal.  Starting to exercise.  Walking more.  He is lost roughly 10 pounds since her last visit.  He does snore and he is fatigued.  Needs a home sleep study.  Denies any chest pain or trouble breathing in office.  We did discuss his diagnosis and treatment options.  For now no need for aggressive treatment.  Problem List Paroxysmal Afib -CHADSVASC=0  Past Medical History: Past Medical History:  Diagnosis Date   Chronic dryness of both eyes    sees Dr. Lorin Picket    Kidney stones    sees Dr. Magdalen Spatz     Past Surgical History: Past Surgical History:  Procedure Laterality Date   epidural steroid shot     upper and lower spine    kidney stones     10 years ago   KNEE ARTHROSCOPY WITH MENISCAL REPAIR Left    SPINE SURGERY     injections    Current Medications: Current Meds  Medication Sig   Levocetirizine Dihydrochloride (XYZAL PO) Take 10 mg by mouth as needed.   metoprolol tartrate (LOPRESSOR) 25 MG tablet Take 1 tablet (25 mg) if heart rate is above 100 for 5 minutes.   Multiple Vitamin (MULTIVITAMIN) tablet Take 1 tablet by mouth.   omeprazole (PRILOSEC) 20 MG capsule  TAKE 1 CAPSULE BY MOUTH EVERY DAY     Allergies:    Codeine   Social History: Social History   Socioeconomic History   Marital status: Married    Spouse name: Not on file   Number of children: 1   Years of education: Not on file   Highest education level: Not on file  Occupational History   Occupation: Tech  Tobacco Use   Smoking status: Some Days    Types: Cigars   Smokeless tobacco: Never   Tobacco comments:    occ  Substance and Sexual Activity   Alcohol use: Yes    Alcohol/week: 0.0 standard drinks    Comment: occ beer   Drug use: No   Sexual activity: Not on file  Other Topics Concern   Not on file  Social History Narrative   Not on file   Social Determinants of Health   Financial Resource Strain: Not on file  Food Insecurity: Not on file  Transportation Needs: Not on file  Physical Activity: Not on file  Stress: Not on file  Social Connections: Not on file     Family History: The patient's family history includes Arrhythmia in his mother; Breast cancer in his mother; Cancer in his maternal grandfather, mother, and paternal grandfather; Stroke in his  mother.  ROS:   All other ROS reviewed and negative. Pertinent positives noted in the HPI.     EKGs/Labs/Other Studies Reviewed:   The following studies were personally reviewed by me today:  EKG:  EKG is ordered today.  The ekg ordered today demonstrates normal sinus rhythm heart rate 86, incomplete right bundle branch block, no acute ischemic changes, and was personally reviewed by me.   Recent Labs: 11/04/2021: BUN 22; Creat 1.11; Hemoglobin 16.4; Platelets 296; Potassium 4.6; Sodium 141; TSH 2.16   Recent Lipid Panel    Component Value Date/Time   CHOL 229 (H) 04/30/2017 0840   TRIG 167.0 (H) 04/30/2017 0840   HDL 47.20 04/30/2017 0840   CHOLHDL 5 04/30/2017 0840   VLDL 33.4 04/30/2017 0840   LDLCALC 149 (H) 04/30/2017 0840    Physical Exam:   VS:  BP 112/70   Pulse 86   Ht 6\' 1"  (1.854 m)    Wt 219 lb (99.3 kg)   SpO2 95%   BMI 28.89 kg/m    Wt Readings from Last 3 Encounters:  01/11/22 219 lb (99.3 kg)  11/22/21 219 lb 12.8 oz (99.7 kg)  11/04/21 216 lb (98 kg)    General: Well nourished, well developed, in no acute distress Head: Atraumatic, normal size  Eyes: PEERLA, EOMI  Neck: Supple, no JVD Endocrine: No thryomegaly Cardiac: Normal S1, S2; RRR; no murmurs, rubs, or gallops Lungs: Clear to auscultation bilaterally, no wheezing, rhonchi or rales  Abd: Soft, nontender, no hepatomegaly  Ext: No edema, pulses 2+ Musculoskeletal: No deformities, BUE and BLE strength normal and equal Skin: Warm and dry, no rashes   Neuro: Alert and oriented to person, place, time, and situation, CNII-XII grossly intact, no focal deficits  Psych: Normal mood and affect   ASSESSMENT:   Isaac [name] is a 46 y.o. male who presents for the following: 1. Paroxysmal atrial fibrillation (HCC)   2. Snoring   3. Chronic fatigue     PLAN:   1. Paroxysmal atrial fibrillation (HCC) -Paroxysmal atrial fibrillation has been captured on his cardia mobile.  Having infrequent episodes.  2 in the last month.  Symptoms lasted roughly 5 minutes.  Seem to be improving.  Denies any significant chest pain or trouble breathing.  Most recent thyroid study is normal.  He needs an echocardiogram.  He is having infrequent symptoms.  Possibly has sleep apnea.  Work-up as below.  Does not need anticoagulation.  I have recommended metoprolol tartrate as needed if his A-fib comes back.  We will see if we can follow his symptom burden.  Currently not having a lot of A-fib.  If he does continue to have more episodes of atrial fibrillation would recommend cardiac ablation.  He needs an echocardiogram in the interim.  He will see me back.  He will monitor his symptoms.  If we need to see him back sooner we will.  2. Snoring 3. Chronic fatigue -He does snore.  He is fatigued.  Given new A-fib needs a sleep study.  We  will set him up for a home sleep study.  Disposition: Return in about 4 months (around 05/14/2022).  Medication Adjustments/Labs and Tests Ordered: Current medicines are reviewed at length with the patient today.  Concerns regarding medicines are outlined above.  Orders Placed This Encounter  Procedures   EKG 12-Lead   ECHOCARDIOGRAM COMPLETE   Home sleep test   Meds ordered this encounter  Medications   metoprolol tartrate (LOPRESSOR) 25  MG tablet    Sig: Take 1 tablet (25 mg) if heart rate is above 100 for 5 minutes.    Dispense:  30 tablet    Refill:  2    Patient Instructions  Medication Instructions:  TAKE Metoprolol Tartrate 25 mg as needed for heart rate above 100 for 5 minutes.   *If you need a refill on your cardiac medications before your next appointment, please call your pharmacy*   Testing/Procedures: Echocardiogram - Your physician has requested that you have an echocardiogram. Echocardiography is a painless test that uses sound waves to create images of your heart. It provides your doctor with information about the size and shape of your heart and how well your heart's chambers and valves are working. This procedure takes approximately one hour. There are no restrictions for this procedure.   Your physician has recommended that you have a sleep study. This test records several body functions during sleep, including: brain activity, eye movement, oxygen and carbon dioxide blood levels, heart rate and rhythm, breathing rate and rhythm, the flow of air through your mouth and nose, snoring, body muscle movements, and chest and belly movement.    Follow-Up: At [facility], you and your health needs are our priority.  As part of our continuing mission to provide you with exceptional heart care, we have created designated Provider Care Teams.  These Care Teams include your primary Cardiologist (physician) and Advanced Practice Providers (APPs -  Physician Assistants and  Nurse Practitioners) who all work together to provide you with the care you need, when you need it.  We recommend signing up for the patient portal called "MyChart".  Sign up information is provided on this After Visit Summary.  MyChart is used to connect with patients for Virtual Visits (Telemedicine).  Patients are able to view lab/test results, encounter notes, upcoming appointments, etc.  Non-urgent messages can be sent to your provider as well.   To learn more about what you can do with MyChart, go to [email or web address].    Your next appointment:   4 month(s)  The format for your next appointment:   In Person  Provider:   Lennie OdorWesley O'Neal, MD            Time Spent with Patient: I have spent a total of 35 minutes with patient reviewing hospital notes, telemetry, EKGs, labs and examining the patient as well as establishing an assessment and plan that was discussed with the patient.  > 50% of time was spent in direct patient care.  Signed, Lenna GilfordWesley T. Flora Lipps'Neal, MD, [facility] [place]  [facility]  [address] [place], [place] [address] [phone]  01/11/2022 3:32 PM

## 2022-01-11 ENCOUNTER — Encounter: Payer: Self-pay | Admitting: Cardiovascular Disease

## 2022-01-11 ENCOUNTER — Ambulatory Visit: Payer: BC Managed Care – PPO | Admitting: Cardiovascular Disease

## 2022-01-11 VITALS — BP 112/70 | HR 86 | Ht 73.0 in | Wt 219.0 lb

## 2022-01-11 DIAGNOSIS — R5382 Chronic fatigue, unspecified: Secondary | ICD-10-CM | POA: Diagnosis not present

## 2022-01-11 DIAGNOSIS — R0683 Snoring: Secondary | ICD-10-CM | POA: Diagnosis not present

## 2022-01-11 DIAGNOSIS — I480 Paroxysmal atrial fibrillation: Secondary | ICD-10-CM | POA: Diagnosis not present

## 2022-01-11 MED ORDER — METOPROLOL TARTRATE 25 MG PO TABS
ORAL_TABLET | ORAL | 2 refills | Status: AC
Start: 2022-01-11 — End: ?

## 2022-01-11 NOTE — Patient Instructions (Signed)
Medication Instructions:  TAKE Metoprolol Tartrate 25 mg as needed for heart rate above 100 for 5 minutes.   *If you need a refill on your cardiac medications before your next appointment, please call your pharmacy*   Testing/Procedures: Echocardiogram - Your physician has requested that you have an echocardiogram. Echocardiography is a painless test that uses sound waves to create images of your heart. It provides your doctor with information about the size and shape of your heart and how well your heart's chambers and valves are working. This procedure takes approximately one hour. There are no restrictions for this procedure.   Your physician has recommended that you have a sleep study. This test records several body functions during sleep, including: brain activity, eye movement, oxygen and carbon dioxide blood levels, heart rate and rhythm, breathing rate and rhythm, the flow of air through your mouth and nose, snoring, body muscle movements, and chest and belly movement.    Follow-Up: At [facility], you and your health needs are our priority.  As part of our continuing mission to provide you with exceptional heart care, we have created designated Provider Care Teams.  These Care Teams include your primary Cardiologist (physician) and Advanced Practice Providers (APPs -  Physician Assistants and Nurse Practitioners) who all work together to provide you with the care you need, when you need it.  We recommend signing up for the patient portal called "MyChart".  Sign up information is provided on this After Visit Summary.  MyChart is used to connect with patients for Virtual Visits (Telemedicine).  Patients are able to view lab/test results, encounter notes, upcoming appointments, etc.  Non-urgent messages can be sent to your provider as well.   To learn more about what you can do with MyChart, go to [email or web address].    Your next appointment:   4 month(s)  The format for your next  appointment:   In Person  Provider:   Lennie Odor, MD

## 2022-01-19 DIAGNOSIS — R7309 Other abnormal glucose: Secondary | ICD-10-CM | POA: Diagnosis not present

## 2022-01-31 ENCOUNTER — Ambulatory Visit (HOSPITAL_COMMUNITY): Payer: BC Managed Care – PPO | Attending: Cardiology

## 2022-01-31 DIAGNOSIS — I480 Paroxysmal atrial fibrillation: Secondary | ICD-10-CM | POA: Diagnosis not present

## 2022-02-01 LAB — ECHOCARDIOGRAM COMPLETE
Area-P 1/2: 4.04 cm2
S' Lateral: 2.9 cm

## 2022-02-02 DIAGNOSIS — J301 Allergic rhinitis due to pollen: Secondary | ICD-10-CM | POA: Diagnosis not present

## 2022-02-02 DIAGNOSIS — J3089 Other allergic rhinitis: Secondary | ICD-10-CM | POA: Diagnosis not present

## 2022-02-03 DIAGNOSIS — J3081 Allergic rhinitis due to animal (cat) (dog) hair and dander: Secondary | ICD-10-CM | POA: Diagnosis not present

## 2022-02-03 DIAGNOSIS — J3089 Other allergic rhinitis: Secondary | ICD-10-CM | POA: Diagnosis not present

## 2022-02-03 DIAGNOSIS — J301 Allergic rhinitis due to pollen: Secondary | ICD-10-CM | POA: Diagnosis not present

## 2022-02-13 DIAGNOSIS — J3081 Allergic rhinitis due to animal (cat) (dog) hair and dander: Secondary | ICD-10-CM | POA: Diagnosis not present

## 2022-02-13 DIAGNOSIS — J301 Allergic rhinitis due to pollen: Secondary | ICD-10-CM | POA: Diagnosis not present

## 2022-02-13 DIAGNOSIS — J3089 Other allergic rhinitis: Secondary | ICD-10-CM | POA: Diagnosis not present

## 2022-02-18 DIAGNOSIS — R7309 Other abnormal glucose: Secondary | ICD-10-CM | POA: Diagnosis not present

## 2022-02-20 ENCOUNTER — Other Ambulatory Visit: Payer: Self-pay | Admitting: Family Medicine

## 2022-03-01 DIAGNOSIS — J3089 Other allergic rhinitis: Secondary | ICD-10-CM | POA: Diagnosis not present

## 2022-03-01 DIAGNOSIS — J301 Allergic rhinitis due to pollen: Secondary | ICD-10-CM | POA: Diagnosis not present

## 2022-03-07 DIAGNOSIS — F4323 Adjustment disorder with mixed anxiety and depressed mood: Secondary | ICD-10-CM | POA: Diagnosis not present

## 2022-03-15 ENCOUNTER — Ambulatory Visit (HOSPITAL_BASED_OUTPATIENT_CLINIC_OR_DEPARTMENT_OTHER): Payer: BC Managed Care – PPO | Attending: Cardiovascular Disease | Admitting: Cardiology

## 2022-03-15 DIAGNOSIS — G4733 Obstructive sleep apnea (adult) (pediatric): Secondary | ICD-10-CM | POA: Insufficient documentation

## 2022-03-15 DIAGNOSIS — R5382 Chronic fatigue, unspecified: Secondary | ICD-10-CM | POA: Diagnosis not present

## 2022-03-15 DIAGNOSIS — R0683 Snoring: Secondary | ICD-10-CM | POA: Diagnosis not present

## 2022-03-16 NOTE — Procedures (Signed)
   Patient Name: Isaac, [name] Date: 03/15/2022 Gender: Male D.O.B: 21-Jan-1976 Age (years): 46 Referring Provider: Ronnald Ramp ONeal Height (inches): 73 Interpreting Physician: Armanda Magic MD, ABSM Weight (lbs): 219 RPSGT: Garland Sink BMI: 29 MRN: [id number] Neck Size: 19.00  CLINICAL INFORMATION Sleep Study Type: HST  Indication for sleep study: N/A  Epworth Sleepiness Score: N/A  SLEEP STUDY TECHNIQUE A multi-channel overnight portable sleep study was performed. The channels recorded were: nasal airflow, thoracic respiratory movement, and oxygen saturation with a pulse oximetry. Snoring was also monitored.  MEDICATIONS Patient self administered medications include: N/A.  SLEEP ARCHITECTURE Patient was studied for 330 minutes. The sleep efficiency was 100.0 % and the patient was supine for 0%. The arousal index was 0.0 per hour.  RESPIRATORY PARAMETERS The overall AHI was 20.5 per hour, with a central apnea index of 0 per hour.  The oxygen nadir was 82% during sleep.  CARDIAC DATA Mean heart rate during sleep was 68.1 bpm.  IMPRESSIONS - Moderate obstructive sleep apnea occurred during this study (AHI = 20.5/h). - Moderate oxygen desaturation was noted during this study (Min O2 = 82%). - Patient snored 36.8% during the sleep.  DIAGNOSIS - Obstructive Sleep Apnea (G47.33)  RECOMMENDATIONS - Recommend in lab CPAP titration. - Positional therapy avoiding supine position during sleep. - Avoid alcohol, sedatives and other CNS depressants that may worsen sleep apnea and disrupt normal sleep architecture. - Sleep hygiene should be reviewed to assess factors that may improve sleep quality. - Weight management and regular exercise should be initiated or continued.  [Electronically signed] 03/16/2022 12:15 PM  Armanda Magic MD, ABSM Diplomate, American Board of Sleep Medicine NPI: [id number]

## 2022-03-21 DIAGNOSIS — R7309 Other abnormal glucose: Secondary | ICD-10-CM | POA: Diagnosis not present

## 2022-04-03 ENCOUNTER — Telehealth: Payer: Self-pay | Admitting: *Deleted

## 2022-04-03 DIAGNOSIS — G4733 Obstructive sleep apnea (adult) (pediatric): Secondary | ICD-10-CM

## 2022-04-03 DIAGNOSIS — R0683 Snoring: Secondary | ICD-10-CM

## 2022-04-03 NOTE — Telephone Encounter (Signed)
The patient has been notified of the result and verbalized understanding.  All questions (if any) were answered. Isaac [name], CMA 04/03/2022 5:40 PM    Patient declines to go in the lab for the cpap titration.  Per Dr Mayford Knife,  If unable to perform an in lab titration then initiate ResMed auto CPAP from 4 to 15cm H2O with heated humidity and mask of choice and overnight pulse ox on CPAP.

## 2022-04-03 NOTE — Telephone Encounter (Signed)
-----   Message from Gaynelle Cage, [place] sent at 03/21/2022  9:24 AM EDT -----  ----- Message ----- From: Quintella Reichert, MD Sent: 03/16/2022  12:16 PM EDT To: Cv Div Sleep Studies  Please let patient know that they have sleep apnea.  Recommend therapeutic CPAP titration for treatment of patient's sleep disordered breathing.  If unable to perform an in lab titration then initiate ResMed auto CPAP from 4 to 15cm H2O with heated humidity and mask of choice and overnight pulse ox on CPAP.

## 2022-04-21 DIAGNOSIS — R7309 Other abnormal glucose: Secondary | ICD-10-CM | POA: Diagnosis not present

## 2022-05-01 ENCOUNTER — Encounter: Payer: Self-pay | Admitting: Cardiovascular Disease

## 2022-05-07 NOTE — Progress Notes (Deleted)
Cardiology Office Note:   Date:  05/07/2022  NAME:  Isaac [name]    MRN: [id number] DOB:  September 12, 1975   PCP:  Nelwyn Salisbury, MD  Cardiologist:  None  Electrophysiologist:  None   Referring MD: Nelwyn Salisbury, MD   No chief complaint on file. ***  History of Present Illness:   Isaac [name] is a 46 y.o. male with a hx of pAF who presents for follow-up.   Problem List Paroxysmal Afib -CHADSVASC=0  Past Medical History: Past Medical History:  Diagnosis Date   Chronic dryness of both eyes    sees Dr. Lorin Picket    Kidney stones    sees Dr. Magdalen Spatz     Past Surgical History: Past Surgical History:  Procedure Laterality Date   epidural steroid shot     upper and lower spine    kidney stones     10 years ago   KNEE ARTHROSCOPY WITH MENISCAL REPAIR Left    SPINE SURGERY     injections    Current Medications: No outpatient medications have been marked as taking for the 05/10/22 encounter (Appointment) with Sande Rives, MD.     Allergies:    Codeine   Social History: Social History   Socioeconomic History   Marital status: Married    Spouse name: Not on file   Number of children: 1   Years of education: Not on file   Highest education level: Not on file  Occupational History   Occupation: Tech  Tobacco Use   Smoking status: Some Days    Types: Cigars   Smokeless tobacco: Never   Tobacco comments:    occ  Substance and Sexual Activity   Alcohol use: Yes    Alcohol/week: 0.0 standard drinks of alcohol    Comment: occ beer   Drug use: No   Sexual activity: Not on file  Other Topics Concern   Not on file  Social History Narrative   Not on file   Social Determinants of Health   Financial Resource Strain: Not on file  Food Insecurity: Not on file  Transportation Needs: Not on file  Physical Activity: Not on file  Stress: Not on file  Social Connections: Not on file     Family History: The patient's ***family history includes Arrhythmia  in his mother; Breast cancer in his mother; Cancer in his maternal grandfather, mother, and paternal grandfather; Stroke in his mother.  ROS:   All other ROS reviewed and negative. Pertinent positives noted in the HPI.     EKGs/Labs/Other Studies Reviewed:   The following studies were personally reviewed by me today:  EKG:  EKG is *** ordered today.  The ekg ordered today demonstrates ***, and was personally reviewed by me.   TTE 01/31/2022  1. Left ventricular ejection fraction, by estimation, is 60 to 65%. The  left ventricle has normal function. The left ventricle has no regional  wall motion abnormalities. Left ventricular diastolic parameters were  normal.   2. Right ventricular systolic function is normal. The right ventricular  size is normal. Tricuspid regurgitation signal is inadequate for assessing  PA pressure.   3. The mitral valve is normal in structure. Trivial mitral valve  regurgitation. No evidence of mitral stenosis.   4. The aortic valve is tricuspid. Aortic valve regurgitation is not  visualized. No aortic stenosis is present.   5. The inferior vena cava is normal in size with greater than 50%  respiratory variability, suggesting right  atrial pressure of 3 mmHg.   Recent Labs: 11/04/2021: BUN 22; Creat 1.11; Hemoglobin 16.4; Platelets 296; Potassium 4.6; Sodium 141; TSH 2.16   Recent Lipid Panel    Component Value Date/Time   CHOL 229 (H) 04/30/2017 0840   TRIG 167.0 (H) 04/30/2017 0840   HDL 47.20 04/30/2017 0840   CHOLHDL 5 04/30/2017 0840   VLDL 33.4 04/30/2017 0840   LDLCALC 149 (H) 04/30/2017 0840    Physical Exam:   VS:  There were no vitals taken for this visit.   Wt Readings from Last 3 Encounters:  03/15/22 220 lb (99.8 kg)  01/11/22 219 lb (99.3 kg)  11/22/21 219 lb 12.8 oz (99.7 kg)    General: Well nourished, well developed, in no acute distress Head: Atraumatic, normal size  Eyes: PEERLA, EOMI  Neck: Supple, no JVD Endocrine: No  thryomegaly Cardiac: Normal S1, S2; RRR; no murmurs, rubs, or gallops Lungs: Clear to auscultation bilaterally, no wheezing, rhonchi or rales  Abd: Soft, nontender, no hepatomegaly  Ext: No edema, pulses 2+ Musculoskeletal: No deformities, BUE and BLE strength normal and equal Skin: Warm and dry, no rashes   Neuro: Alert and oriented to person, place, time, and situation, CNII-XII grossly intact, no focal deficits  Psych: Normal mood and affect   ASSESSMENT:   Isaac [name] is a 46 y.o. male who presents for the following: No diagnosis found.  PLAN:   There are no diagnoses linked to this encounter.  {Are you ordering a CV Procedure (e.g. stress test, cath, DCCV, TEE, etc)?   Press F2        :[id number]}  Disposition: No follow-ups on file.  Medication Adjustments/Labs and Tests Ordered: Current medicines are reviewed at length with the patient today.  Concerns regarding medicines are outlined above.  No orders of the defined types were placed in this encounter.  No orders of the defined types were placed in this encounter.   There are no Patient Instructions on file for this visit.   Time Spent with Patient: I have spent a total of *** minutes with patient reviewing hospital notes, telemetry, EKGs, labs and examining the patient as well as establishing an assessment and plan that was discussed with the patient.  > 50% of time was spent in direct patient care.  Signed, Addison Naegeli. Audie Box, MD, [place]  [address], [place], [place] [address] [phone]  05/07/2022 8:00 PM

## 2022-05-09 ENCOUNTER — Encounter: Payer: Self-pay | Admitting: Family Medicine

## 2022-05-09 ENCOUNTER — Other Ambulatory Visit: Payer: Self-pay | Admitting: Family Medicine

## 2022-05-09 NOTE — Telephone Encounter (Signed)
Tell him to increase the Omeprazole to BID for a week and then report back

## 2022-05-10 ENCOUNTER — Ambulatory Visit: Payer: BC Managed Care – PPO | Admitting: Cardiovascular Disease

## 2022-05-10 DIAGNOSIS — I480 Paroxysmal atrial fibrillation: Secondary | ICD-10-CM

## 2022-05-10 NOTE — Telephone Encounter (Signed)
Upon patient request DME selection is [facility] Patient understands he will be contacted by [facility] to set up his cpap. Patient understands to call if [facility] does not contact him with new setup in a timely manner. Patient understands they will be called once confirmation has been received from [facility] that they have received their new machine to schedule 10 week follow up appointment.   [facility] notified of new cpap order  Please add to airview Patient was grateful for the call and thanked me.  

## 2022-05-12 ENCOUNTER — Telehealth: Payer: Self-pay | Admitting: Cardiovascular Disease

## 2022-05-12 NOTE — Telephone Encounter (Signed)
Returned call to patient who states that he is not sure who called him. Patient states that he was on a wait list and thought it might be about getting rescheduled with Dr. Audie Box. Advised patient that I would forward back to scheduling. Patient verbalized understanding.

## 2022-05-12 NOTE — Telephone Encounter (Signed)
Patient states he was returning a call. Please advise 

## 2022-05-19 ENCOUNTER — Other Ambulatory Visit: Payer: Self-pay

## 2022-05-19 DIAGNOSIS — K219 Gastro-esophageal reflux disease without esophagitis: Secondary | ICD-10-CM

## 2022-05-19 MED ORDER — OMEPRAZOLE 20 MG PO CPDR: 20.0000 mg | DELAYED_RELEASE_CAPSULE | Freq: Two times a day (BID) | ORAL | 0 refills | Status: DC

## 2022-05-19 NOTE — Telephone Encounter (Signed)
Excellent. Have him stay on the BID dosing. Please call in refills if he needs them

## 2022-05-21 DIAGNOSIS — R7309 Other abnormal glucose: Secondary | ICD-10-CM | POA: Diagnosis not present

## 2022-05-29 NOTE — Progress Notes (Signed)
Cardiology Office Note:    Date:  06/08/2022   ID:  Isaac [name], DOB 1976/01/26, MRN [id number]  PCP:  Laurey Morale, MD   [facility] Providers Cardiologist:  Evalina Field, MD   Referring MD: Laurey Morale, MD   Chief Complaint  Patient presents with   Follow-up    PAF, OSA    History of Present Illness:    Isaac [name] is a 46 y.o. male with a hx of RBBB, OSA, on PAF captured on kardia mobile device. PAF accompanied by chest discomfort.  Episodes of A-fib are short-lived. He has as needed Lopressor to use. He is not anticoagulated with a CHA2DS2-VASc score of 0.  TSH normal.  A home sleep study was recommended for snoring and daytime fatigue.  He did complete a home sleep study and was found to have sleep apnea.  In lab CPAP titration was recommended versus ResMed auto CPAP with overnight pulse ox.    He presents today for cardiology follow-up. He has had 2 more episodes of Afib - one lasted for 1 hr and he took a PRN lopressor. Another episode that lasted for 5 minutes. He is symptomatic. He just now received his CPAP today due to miscommunication with [place]. He will do the auto-titration tonight. His mother has Afib. He is having Afib about once monthly. We discussed EP referral for ablation and he wants to think about this. I think its worth seeing if controlling his OSA will eliminate his Afib for now.   Past Medical History:  Diagnosis Date   Chronic dryness of both eyes    sees Dr. Nicki Reaper    Kidney stones    sees Dr. Orma Render     Past Surgical History:  Procedure Laterality Date   epidural steroid shot     upper and lower spine    kidney stones     10 years ago   KNEE ARTHROSCOPY WITH MENISCAL REPAIR Left    SPINE SURGERY     injections    Current Medications: Current Meds  Medication Sig   Levocetirizine Dihydrochloride (XYZAL PO) Take 10 mg by mouth as needed.   metoprolol tartrate (LOPRESSOR) 25 MG tablet Take 1 tablet (25 mg) if  heart rate is above 100 for 5 minutes.   Multiple Vitamin (MULTIVITAMIN) tablet Take 1 tablet by mouth.   omeprazole (PRILOSEC) 20 MG capsule Take 1 capsule (20 mg total) by mouth 2 (two) times daily before a meal.     Allergies:   Codeine   Social History   Socioeconomic History   Marital status: Married    Spouse name: Not on file   Number of children: 1   Years of education: Not on file   Highest education level: Not on file  Occupational History   Occupation: Tech  Tobacco Use   Smoking status: Some Days    Types: Cigars   Smokeless tobacco: Never   Tobacco comments:    occ  Substance and Sexual Activity   Alcohol use: Yes    Alcohol/week: 0.0 standard drinks of alcohol    Comment: occ beer   Drug use: No   Sexual activity: Not on file  Other Topics Concern   Not on file  Social History Narrative   Not on file   Social Determinants of Health   Financial Resource Strain: Not on file  Food Insecurity: Not on file  Transportation Needs: Not on file  Physical Activity: Not on  file  Stress: Not on file  Social Connections: Not on file     Family History: The patient's family history includes Arrhythmia in his mother; Breast cancer in his mother; Cancer in his maternal grandfather, mother, and paternal grandfather; Stroke in his mother.  ROS:   Please see the history of present illness.     All other systems reviewed and are negative.  EKGs/Labs/Other Studies Reviewed:    The following studies were reviewed today:  Echo 01/2022: 1. Left ventricular ejection fraction, by estimation, is 60 to 65%. The  left ventricle has normal function. The left ventricle has no regional  wall motion abnormalities. Left ventricular diastolic parameters were  normal.   2. Right ventricular systolic function is normal. The right ventricular  size is normal. Tricuspid regurgitation signal is inadequate for assessing  PA pressure.   3. The mitral valve is normal in structure.  Trivial mitral valve  regurgitation. No evidence of mitral stenosis.   4. The aortic valve is tricuspid. Aortic valve regurgitation is not  visualized. No aortic stenosis is present.   5. The inferior vena cava is normal in size with greater than 50%  respiratory variability, suggesting right atrial pressure of 3 mmHg.   EKG:  EKG is not ordered today.    Recent Labs: 11/04/2021: BUN 22; Creat 1.11; Hemoglobin 16.4; Platelets 296; Potassium 4.6; Sodium 141; TSH 2.16  Recent Lipid Panel    Component Value Date/Time   CHOL 229 (H) 04/30/2017 0840   TRIG 167.0 (H) 04/30/2017 0840   HDL 47.20 04/30/2017 0840   CHOLHDL 5 04/30/2017 0840   VLDL 33.4 04/30/2017 0840   LDLCALC 149 (H) 04/30/2017 0840     Risk Assessment/Calculations:    CHA2DS2-VASc Score = 0   This indicates a 0.2% annual risk of stroke. The patient's score is based upon: CHF History: 0 HTN History: 0 Diabetes History: 0 Stroke History: 0 Vascular Disease History: 0 Age Score: 0 Gender Score: 0     Physical Exam:    VS:  BP 124/70   Pulse 77   Ht 6\' 1"  (1.854 m)   Wt 230 lb 6.4 oz (104.5 kg)   SpO2 96%   BMI 30.40 kg/m     Wt Readings from Last 3 Encounters:  06/08/22 230 lb 6.4 oz (104.5 kg)  03/15/22 220 lb (99.8 kg)  01/11/22 219 lb (99.3 kg)     GEN:  Well nourished, well developed in no acute distress HEENT: Normal NECK: No JVD; No carotid bruits LYMPHATICS: No lymphadenopathy CARDIAC: RRR, no murmurs, rubs, gallops RESPIRATORY:  Clear to auscultation without rales, wheezing or rhonchi  ABDOMEN: Soft, non-tender, non-distended MUSCULOSKELETAL:  No edema; No deformity  SKIN: Warm and dry NEUROLOGIC:  Alert and oriented x 3 PSYCHIATRIC:  Normal affect   ASSESSMENT:    1. Paroxysmal atrial fibrillation (HCC)   2. OSA (obstructive sleep apnea)    PLAN:    In order of problems listed above:  PAF Low burden Not anticoagulated for CHA2DS2-VASc score of 0 He has as needed Lopressor to  use - he did take 25 mg lopressor but continued to have Afib for an hour. I advised he could take another half tablet if Afib RVR lasts longer than 1 hr. Sinus rhythm today in the high 70s.   OSA now on CPAP Just got his CPAP today. Will complete auto titration tonight.      Follow up in 6 months.    Medication Adjustments/Labs and Tests  Ordered: Current medicines are reviewed at length with the patient today.  Concerns regarding medicines are outlined above.  No orders of the defined types were placed in this encounter.  No orders of the defined types were placed in this encounter.   Patient Instructions  Medication Instructions:  Your physician recommends that you continue on your current medications as directed. Please refer to the Current Medication list given to you today.   *If you need a refill on your cardiac medications before your next appointment, please call your pharmacy*   Lab Work: NONE ordered at this time of appointment   If you have labs (blood work) drawn today and your tests are completely normal, you will receive your results only by: [place] (if you have MyChart) OR A paper copy in the mail If you have any lab test that is abnormal or we need to change your treatment, we will call you to review the results.   Testing/Procedures: NONE ordered at this time of appointment     Follow-Up: At [facility], you and your health needs are our priority.  As part of our continuing mission to provide you with exceptional heart care, we have created designated Provider Care Teams.  These Care Teams include your primary Cardiologist (physician) and Advanced Practice Providers (APPs -  Physician Assistants and Nurse Practitioners) who all work together to provide you with the care you need, when you need it.  We recommend signing up for the patient portal called "MyChart".  Sign up information is provided on this After Visit Summary.  MyChart is used  to connect with patients for Virtual Visits (Telemedicine).  Patients are able to view lab/test results, encounter notes, upcoming appointments, etc.  Non-urgent messages can be sent to your provider as well.   To learn more about what you can do with MyChart, go to [email or web address].    Your next appointment:   6 month(s)  The format for your next appointment:   In Person  Provider:   Evalina Field, MD     Other Instructions   Important Information About Sugar      \    Signed, Ledora Bottcher, [place]  06/08/2022 4:40 PM    [place]

## 2022-06-08 ENCOUNTER — Ambulatory Visit: Payer: BC Managed Care – PPO | Attending: Cardiovascular Disease | Admitting: Physician Assistant

## 2022-06-08 ENCOUNTER — Encounter: Payer: Self-pay | Admitting: Physician Assistant

## 2022-06-08 VITALS — BP 124/70 | HR 77 | Ht 73.0 in | Wt 230.4 lb

## 2022-06-08 DIAGNOSIS — G4733 Obstructive sleep apnea (adult) (pediatric): Secondary | ICD-10-CM

## 2022-06-08 DIAGNOSIS — I480 Paroxysmal atrial fibrillation: Secondary | ICD-10-CM

## 2022-06-08 NOTE — Patient Instructions (Signed)
Medication Instructions:  Your physician recommends that you continue on your current medications as directed. Please refer to the Current Medication list given to you today.   *If you need a refill on your cardiac medications before your next appointment, please call your pharmacy*   Lab Work: NONE ordered at this time of appointment   If you have labs (blood work) drawn today and your tests are completely normal, you will receive your results only by: MyChart Message (if you have MyChart) OR A paper copy in the mail If you have any lab test that is abnormal or we need to change your treatment, we will call you to review the results.   Testing/Procedures: NONE ordered at this time of appointment     Follow-Up: At [facility], you and your health needs are our priority.  As part of our continuing mission to provide you with exceptional heart care, we have created designated Provider Care Teams.  These Care Teams include your primary Cardiologist (physician) and Advanced Practice Providers (APPs -  Physician Assistants and Nurse Practitioners) who all work together to provide you with the care you need, when you need it.  We recommend signing up for the patient portal called "MyChart".  Sign up information is provided on this After Visit Summary.  MyChart is used to connect with patients for Virtual Visits (Telemedicine).  Patients are able to view lab/test results, encounter notes, upcoming appointments, etc.  Non-urgent messages can be sent to your provider as well.   To learn more about what you can do with MyChart, go to [email or web address].    Your next appointment:   6 month(s)  The format for your next appointment:   In Person  Provider:   Latimer T O'Neal, MD     Other Instructions   Important Information About Sugar       

## 2022-06-14 DIAGNOSIS — J3089 Other allergic rhinitis: Secondary | ICD-10-CM | POA: Diagnosis not present

## 2022-06-14 DIAGNOSIS — J301 Allergic rhinitis due to pollen: Secondary | ICD-10-CM | POA: Diagnosis not present

## 2022-06-14 DIAGNOSIS — R058 Other specified cough: Secondary | ICD-10-CM | POA: Diagnosis not present

## 2022-06-15 ENCOUNTER — Telehealth: Payer: Self-pay | Admitting: Cardiology

## 2022-06-15 NOTE — Telephone Encounter (Signed)
Crystal from [employer] on behalf of pt. She states he told her that he feels like he isn't getting enough pressure in his  CPAP and wants to increase the pressure

## 2022-06-16 NOTE — Telephone Encounter (Signed)
Isaac [name], Isaac [name] 05/17/2022 - 06/15/2022 Patient ID: [id number] DOB: [birth year] Age: 46 years [place] Phone: [phone] Fax: [phone] Email: [email or web address] Compliance Report Compliance Payor Standard Usage 05/17/2022 - 06/15/2022 Usage days 6/30 days (20%) >= 4 hours 5 days (17%) < 4 hours 1 days (3%) Usage hours 26 hours 55 minutes Average usage (total days) 54 minutes Average usage (days used) 4 hours 29 minutes Median usage (days used) 4 hours 7 minutes Total used hours (value since last reset - 06/15/2022) 26 hours AirSense 11 AutoSet Serial number [id number] Mode AutoSet Min Pressure 4 cmH2O Max Pressure 15 cmH2O EPR Ramp Only EPR level 3 Response Standard Therapy Pressure - cmH2O Median: 5.5 95th percentile: 7.2 Maximum: 8.1 Leaks - L/min Median: 1.2 95th percentile: 4.6 Maximum: 14.2 Events per hour AI: 3.5 HI: 0.2 AHI: 3.7 Apnea Index Central: 3.1 Obstructive: 0.4 Unknown: 0.0 RERA Index 0.0 Cheyne-Stokes respiration (average duration per night) 0 minutes (0%) Usage - hours Printed on 06/16/2022 - ResMed AirView version 4.42.0-16.0 Page 1 of 1

## 2022-06-19 NOTE — Telephone Encounter (Signed)
Patient self adjusted his pressure from 4-15 to 8-15 and that works well for him. He is happy with his pressure.

## 2022-06-21 DIAGNOSIS — R7309 Other abnormal glucose: Secondary | ICD-10-CM | POA: Diagnosis not present

## 2022-07-03 ENCOUNTER — Ambulatory Visit: Payer: BC Managed Care – PPO | Admitting: Family Medicine

## 2022-07-03 ENCOUNTER — Encounter: Payer: Self-pay | Admitting: Family Medicine

## 2022-07-03 ENCOUNTER — Other Ambulatory Visit: Payer: Self-pay | Admitting: Family Medicine

## 2022-07-03 VITALS — BP 124/84 | HR 73 | Temp 98.1°F | Wt 234.0 lb

## 2022-07-03 DIAGNOSIS — R053 Chronic cough: Secondary | ICD-10-CM

## 2022-07-03 DIAGNOSIS — K219 Gastro-esophageal reflux disease without esophagitis: Secondary | ICD-10-CM

## 2022-07-03 NOTE — Progress Notes (Signed)
   Subjective:    Patient ID: Isaac [name], male    DOB: [birth year], 46 y.o.   MRN: [id number]  HPI Here for the chronic cough he has had for the past 3 years. He still has the tickling sensation in the upper chest that causes a dry cough. He had a normal CXR on 02-16-21. He tried Singulair but did not tolerate it. He has been taking GERD medication steadily. He had also been seeing Dr. [place] Callas for an Allergy perspective, but they were no able to find the source of the cough despite him trying several different inhalers.    Review of Systems  Constitutional: Negative.   Respiratory:  Positive for cough. Negative for choking, shortness of breath and wheezing.   Cardiovascular: Negative.        Objective:   Physical Exam Constitutional:      General: He is not in acute distress.    Appearance: Normal appearance.  Cardiovascular:     Rate and Rhythm: Normal rate and regular rhythm.     Pulses: Normal pulses.     Heart sounds: Normal heart sounds.  Pulmonary:     Effort: Pulmonary effort is normal.     Breath sounds: Normal breath sounds.  Neurological:     Mental Status: He is alert.           Assessment & Plan:  Chronic cough. We will refer him to Pulmonology.  Gershon Crane, MD

## 2022-07-09 DIAGNOSIS — G4733 Obstructive sleep apnea (adult) (pediatric): Secondary | ICD-10-CM | POA: Diagnosis not present

## 2022-07-10 ENCOUNTER — Encounter: Payer: Self-pay | Admitting: Family Medicine

## 2022-07-10 DIAGNOSIS — K219 Gastro-esophageal reflux disease without esophagitis: Secondary | ICD-10-CM

## 2022-07-11 MED ORDER — OMEPRAZOLE 20 MG PO CPDR: 20.0000 mg | DELAYED_RELEASE_CAPSULE | Freq: Two times a day (BID) | ORAL | 3 refills | Status: DC

## 2022-07-11 NOTE — Telephone Encounter (Signed)
I sent in a new RX for two pills a day

## 2022-07-17 DIAGNOSIS — F4323 Adjustment disorder with mixed anxiety and depressed mood: Secondary | ICD-10-CM | POA: Diagnosis not present

## 2022-07-20 ENCOUNTER — Telehealth: Payer: Self-pay

## 2022-07-20 ENCOUNTER — Encounter: Payer: Self-pay | Admitting: Cardiology

## 2022-07-20 ENCOUNTER — Ambulatory Visit: Payer: BC Managed Care – PPO | Attending: Cardiology | Admitting: Cardiology

## 2022-07-20 VITALS — BP 124/84 | HR 71 | Ht 73.0 in | Wt 225.0 lb

## 2022-07-20 DIAGNOSIS — G4733 Obstructive sleep apnea (adult) (pediatric): Secondary | ICD-10-CM | POA: Diagnosis not present

## 2022-07-20 NOTE — Patient Instructions (Signed)
Medication Instructions:  Your physician recommends that you continue on your current medications as directed. Please refer to the Current Medication list given to you today.  *If you need a refill on your cardiac medications before your next appointment, please call your pharmacy*   Lab Work: None If you have labs (blood work) drawn today and your tests are completely normal, you will receive your results only by: MyChart Message (if you have MyChart) OR A paper copy in the mail If you have any lab test that is abnormal or we need to change your treatment, we will call you to review the results.   Testing/Procedures: None  Follow-Up: At [facility], you and your health needs are our priority.  As part of our continuing mission to provide you with exceptional heart care, we have created designated Provider Care Teams.  These Care Teams include your primary Cardiologist (physician) and Advanced Practice Providers (APPs -  Physician Assistants and Nurse Practitioners) who all work together to provide you with the care you need, when you need it.  We recommend signing up for the patient portal called "MyChart".  Sign up information is provided on this After Visit Summary.  MyChart is used to connect with patients for Virtual Visits (Telemedicine).  Patients are able to view lab/test results, encounter notes, upcoming appointments, etc.  Non-urgent messages can be sent to your provider as well.   To learn more about what you can do with MyChart, go to [email or web address].    Your next appointment:   3 month(s)  The format for your next appointment:   Virtual Visit   Provider:   Armanda Magic, MD  Other Instructions Our Sleep Coordinator will order more CPAP supplies for you  Important Information About Sugar

## 2022-07-20 NOTE — Progress Notes (Signed)
SLEEP MEDICINE VIRTUAL CONSULT NOTE via Video Note   Because of Khai A Kearl's co-morbid illnesses, he is at least at moderate risk for complications without adequate follow up.  This format is felt to be most appropriate for this patient at this time.  All issues noted in this document were discussed and addressed.  A limited physical exam was performed with this format.  Please refer to the patient's chart for his consent to telehealth for [facility].      Date:  07/20/2022   ID:  Bebe Liter, DOB 1975-10-21, MRN [id number] The patient was identified using 2 identifiers.  Patient Location: Home Provider Location: Home Office   PCP:  Nelwyn Salisbury, MD   [facility] Providers Cardiologist:  Reatha Harps, MD     Evaluation Performed:  Consultation - Bebe Liter was referred by OSA for the evaluation of Dr. Ninetta Lights, MD.  Chief Complaint:  OSA  History of Present Illness:    EESA JUSTISS is a 45 y.o. male who is being seen today for the evaluation of OSA at the request of Ninetta Lights, MD.  NICOLAAS SAVO is a 46 y.o. male with a hx of PAF.  At last OV he complained to Dr. Carmon Ginsberg that he was snoring.  He has never awakened gasping for breath or snoring that wakes him up.  He has awakened at times feeling he has been holding his breath.  When he wakes up in the am he feels sluggish but attributes it to only getting 6 hours of sleep.  He underwent home sleep study showing moderate OSA with an AHI of 20.5/hr and O2 sats as low as 82%.  He was started on auto CPAP from 4 to 15cm H2O.  He is now referred to establish with a Sleep Medicine MD.   He is doing fairly well with his PAP device but sometimes in the beginning would get frustrated with it at times. He tolerates the nasal pillow mask and feels the pressure is adequate.  Since going on PAP he feels more rested in the am and has no significant daytime sleepiness.  He denies any significant  mouth or nasal dryness.  He does have nasal congestion at times.  He does not think that he snores.     Past Medical History:  Diagnosis Date   Chronic dryness of both eyes    sees Dr. Lorin Picket    Kidney stones    sees Dr. Magdalen Spatz    OSA on CPAP    AHI 20.5/hr now on auto CPAP 6-15cm H2O   Past Surgical History:  Procedure Laterality Date   epidural steroid shot     upper and lower spine    kidney stones     10 years ago   KNEE ARTHROSCOPY WITH MENISCAL REPAIR Left    SPINE SURGERY     injections     Current Meds  Medication Sig   Levocetirizine Dihydrochloride (XYZAL PO) Take 10 mg by mouth as needed.   metoprolol tartrate (LOPRESSOR) 25 MG tablet Take 1 tablet (25 mg) if heart rate is above 100 for 5 minutes.   Multiple Vitamin (MULTIVITAMIN) tablet Take 1 tablet by mouth.   Omega-3 Fatty Acids (FISH OIL PO) Take 2,000 capsules by mouth 2 (two) times daily. Super Omega 3   omeprazole (PRILOSEC) 20 MG capsule Take 1 capsule (20 mg total) by mouth 2 (two) times daily before a  meal.     Allergies:   Codeine and Singulair [montelukast sodium]   Social History   Tobacco Use   Smoking status: Some Days    Types: Cigars   Smokeless tobacco: Never   Tobacco comments:    occ  Substance Use Topics   Alcohol use: Yes    Alcohol/week: 0.0 standard drinks of alcohol    Comment: occ beer   Drug use: No     Family Hx: The patient's family history includes Arrhythmia in his mother; Breast cancer in his mother; Cancer in his maternal grandfather, mother, and paternal grandfather; Stroke in his mother.  ROS:   Please see the history of present illness.     All other systems reviewed and are negative.   Prior Sleep studies:   The following studies were reviewed today:  HST and PAP compliance download  Labs/Other Tests and Data Reviewed:     Recent Labs: 11/04/2021: BUN 22; Creat 1.11; Hemoglobin 16.4; Platelets 296; Potassium 4.6; Sodium 141; TSH 2.16    Wt  Readings from Last 3 Encounters:  07/20/22 225 lb (102.1 kg)  07/03/22 234 lb (106.1 kg)  06/08/22 230 lb 6.4 oz (104.5 kg)     Risk Assessment/Calculations:    CHA2DS2-VASc Score = 0   This indicates a 0.2% annual risk of stroke. The patient's score is based upon: CHF History: 0 HTN History: 0 Diabetes History: 0 Stroke History: 0 Vascular Disease History: 0 Age Score: 0 Gender Score: 0          Objective:    Vital Signs:  BP 124/84   Pulse 71   Ht 6\' 1"  (1.854 m)   Wt 225 lb (102.1 kg)   SpO2 96%   BMI 29.69 kg/m    VITAL SIGNS:  reviewed GEN:  no acute distress EYES:  sclerae anicteric, EOMI - Extraocular Movements Intact RESPIRATORY:  normal respiratory effort, symmetric expansion CARDIOVASCULAR:  no peripheral edema SKIN:  no rash, lesions or ulcers. MUSCULOSKELETAL:  no obvious deformities. NEURO:  alert and oriented x 3, no obvious focal deficit PSYCH:  normal affect  ASSESSMENT & PLAN:    OSA - The patient is tolerating PAP therapy well without any problems. The PAP download performed by his DME was personally reviewed and interpreted by me today and showed an AHI of 1.7/hr on auto CPAP from 6-15 cm H2O with 57% compliance in using more than 4 hours nightly.  The patient has been using and benefiting from PAP use and will continue to benefit from therapy.  -I encouraged him to be more compliant with his device>>His device was not set up correctly initially and had issues exhaling and it took a while for the DME to get the settings correct and now it is working fine -I encouraged him to adjust his humidity in his device and use nasal saline spray   Time:   Today, I have spent 20 minutes with the patient with telehealth technology discussing the above problems.     Medication Adjustments/Labs and Tests Ordered: Current medicines are reviewed at length with the patient today.  Concerns regarding medicines are outlined above.   Tests Ordered: No orders of  the defined types were placed in this encounter.   Medication Changes: No orders of the defined types were placed in this encounter.   Follow Up:  Virtual Visit  in 3 month(s)  Signed, , MD  07/20/2022 8:45 AM    [facility]

## 2022-07-20 NOTE — Telephone Encounter (Signed)
  Patient Consent for Virtual Visit        Isaac [name] has provided verbal consent on 07/20/2022 for a virtual visit (video or telephone).   CONSENT FOR VIRTUAL VISIT FOR:  Isaac [name]  By participating in this virtual visit I agree to the following:  I hereby voluntarily request, consent and authorize [facility] and its employed or contracted physicians, physician assistants, nurse practitioners or other licensed health care professionals (the Practitioner), to provide me with telemedicine health care services (the "Services") as deemed necessary by the treating Practitioner. I acknowledge and consent to receive the Services by the Practitioner via telemedicine. I understand that the telemedicine visit will involve communicating with the Practitioner through live audiovisual communication technology and the disclosure of certain medical information by electronic transmission. I acknowledge that I have been given the opportunity to request an in-person assessment or other available alternative prior to the telemedicine visit and am voluntarily participating in the telemedicine visit.  I understand that I have the right to withhold or withdraw my consent to the use of telemedicine in the course of my care at any time, without affecting my right to future care or treatment, and that the Practitioner or I may terminate the telemedicine visit at any time. I understand that I have the right to inspect all information obtained and/or recorded in the course of the telemedicine visit and may receive copies of available information for a reasonable fee.  I understand that some of the potential risks of receiving the Services via telemedicine include:  Delay or interruption in medical evaluation due to technological equipment failure or disruption; Information transmitted may not be sufficient (e.g. poor resolution of images) to allow for appropriate medical decision making by the  Practitioner; and/or  In rare instances, security protocols could fail, causing a breach of personal health information.  Furthermore, I acknowledge that it is my responsibility to provide information about my medical history, conditions and care that is complete and accurate to the best of my ability. I acknowledge that Practitioner's advice, recommendations, and/or decision may be based on factors not within their control, such as incomplete or inaccurate data provided by me or distortions of diagnostic images or specimens that may result from electronic transmissions. I understand that the practice of medicine is not an exact science and that Practitioner makes no warranties or guarantees regarding treatment outcomes. I acknowledge that a copy of this consent can be made available to me via my patient portal [facility] MyChart), or I can request a printed copy by calling the office of [facility].    I understand that my insurance will be billed for this visit.   I have read or had this consent read to me. I understand the contents of this consent, which adequately explains the benefits and risks of the Services being provided via telemedicine.  I have been provided ample opportunity to ask questions regarding this consent and the Services and have had my questions answered to my satisfaction. I give my informed consent for the services to be provided through the use of telemedicine in my medical care

## 2022-07-21 ENCOUNTER — Telehealth: Payer: Self-pay | Admitting: *Deleted

## 2022-07-21 DIAGNOSIS — G4733 Obstructive sleep apnea (adult) (pediatric): Secondary | ICD-10-CM

## 2022-07-21 DIAGNOSIS — R7309 Other abnormal glucose: Secondary | ICD-10-CM | POA: Diagnosis not present

## 2022-07-21 NOTE — Telephone Encounter (Signed)
Order placed to [facility] for CPAP supplies  through [employer]

## 2022-07-21 NOTE — Telephone Encounter (Signed)
-----   Message from Lattie Haw, RN sent at 07/20/2022  9:26 AM EST ----- Regarding: CPAP Supplies Per Dr. Mayford Knife:  Order CPAP supplies

## 2022-08-04 DIAGNOSIS — G4733 Obstructive sleep apnea (adult) (pediatric): Secondary | ICD-10-CM | POA: Diagnosis not present

## 2022-08-08 DIAGNOSIS — G4733 Obstructive sleep apnea (adult) (pediatric): Secondary | ICD-10-CM | POA: Diagnosis not present

## 2022-08-18 ENCOUNTER — Encounter (HOSPITAL_BASED_OUTPATIENT_CLINIC_OR_DEPARTMENT_OTHER): Payer: Self-pay | Admitting: Pulmonary Disease

## 2022-08-18 ENCOUNTER — Other Ambulatory Visit (HOSPITAL_BASED_OUTPATIENT_CLINIC_OR_DEPARTMENT_OTHER): Payer: Self-pay | Admitting: Pulmonary Disease

## 2022-08-18 ENCOUNTER — Ambulatory Visit (INDEPENDENT_AMBULATORY_CARE_PROVIDER_SITE_OTHER): Payer: BC Managed Care – PPO

## 2022-08-18 ENCOUNTER — Ambulatory Visit (INDEPENDENT_AMBULATORY_CARE_PROVIDER_SITE_OTHER): Payer: BC Managed Care – PPO | Admitting: Pulmonary Disease

## 2022-08-18 VITALS — BP 112/82 | HR 84 | Temp 98.5°F | Ht 73.0 in | Wt 235.4 lb

## 2022-08-18 DIAGNOSIS — R053 Chronic cough: Secondary | ICD-10-CM

## 2022-08-18 MED ORDER — AZELASTINE HCL 0.15 % NA SOLN: 1.0000 | Freq: Every morning | NASAL | 5 refills | Status: AC

## 2022-08-18 MED ORDER — FLUTICASONE PROPIONATE 50 MCG/ACT NA SUSP: 1.0000 | Freq: Every day | NASAL | 2 refills | Status: AC

## 2022-08-18 NOTE — Patient Instructions (Signed)
Chest xray today  Astepro 1 spray in each nostril in the morning  Flonase 1 spray in each nostril before going to bed  Use nasal irrigation twice per day before using your nose sprays  Use xyzal at night  Sip water when you have the urge to cough   Avoid forcing a cough or clearing your throat  Salt water gargle once or twice per day as needed  1 teaspoon of local honey as needed to help with cough  Will get medical records from Dr. Debbe Mounts office  Will arrange for pulmonary function test and follow up in 6 weeks

## 2022-08-18 NOTE — Progress Notes (Signed)
Isaac [name], Isaac [name], Isaac [name]  Chief Complaint  Patient presents with   Consult    Pt states that he has an airway issue for years. Pt would like to find out what's going on.    Past Surgical History:  He  has a past surgical history that includes Knee arthroscopy with meniscal repair (Left); epidural steroid shot; kidney stones; Isaac Spine surgery.  Past Medical History:  Nephrolithiasis, OSA, Allergic rhinitis, PAF  Constitutional:  BP 112/82 (BP Location: Right Arm, Patient Position: Sitting, Cuff Size: Large)   Pulse 84   Temp 98.5 F (36.9 C) (Oral)   Ht 6\' 1"  (1.854 m)   Wt 235 lb 6.4 oz (106.8 kg)   SpO2 96%   BMI 31.06 kg/m   Brief Summary:  Isaac [name] is a 46 y.o. male cigar smoker with chronic cough.      Subjective:   He has noticed trouble with a cough for years.  He was having trouble with acid reflux.  Started on prilosec Isaac this helped some.  He has seasonal allergies in Spring Isaac Fall.  This gets bad when he has to mow his lawn.  He also has sinus trouble when around strong smells.  He has sinus congestion Isaac post nasal drip.  He gets a globus sensation.  He tries to clear his throat or cough, but doesn't usually bring up any phlegm.  He is not having wheeze or chest tightness.  Previous trial of inhalers didn't seem to help.  He has uses xyzal Isaac flonase intermittently; these help when he uses them.  He developed mood disturbance when he was tried on montelukast.  He was seen by Dr. Donneta Romberg previous Isaac had allergy testing.  He denies food allergies.  He developed hives from previous deodorant product, but this resolved once he switched products.  He was found to have sleep apnea in July 2023.  He has been using CPAP with nasal pillows, Isaac this seems to help his sinuses.  Otherwise he always feels like he has trouble breathing through his nose.    He is from [place] originally.  He lived in [place].  He has been in [place] since the 1990's.  He denies animal/bird exposure.  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  He quit smoking cigarettes years ago.  His father was a smoker Isaac followed by [name] for a lung disease (?COPD).  No history of pneumonia or exposure to TB.  Physical Exam:   Appearance - well kempt   ENMT - no sinus tenderness, no oral exudate, no LAN, Mallampati 3 airway, no stridor, deviated nasal septum  Respiratory - equal breath sounds bilaterally, no wheezing or rales  CV - s1s2 regular rate Isaac rhythm, no murmurs  Ext - no clubbing, no edema  Skin - no rashes  Psych - normal mood Isaac affect   [name] testing:    Chest Imaging:    Sleep Tests:  HST 03/15/22 >> AHI 20.5, SpO2 low 82%  Cardiac Tests:  Echo 01/31/22 >> EF 60 to 65%  Social History:  He  reports that he has been smoking cigars. He has been exposed to tobacco smoke. He has never used smokeless tobacco. He reports current alcohol use. He reports that he does not use drugs.  Family History:  His family history includes Arrhythmia in his mother; Breast cancer in his mother; Cancer in his maternal grandfather, mother, Isaac paternal  grandfather; Stroke in his mother.    Discussion:  He reports seasonal allergic rhinitis Isaac irritant rhinitis.  He has post nasal drip Isaac globus sensation.  He has a deviated nasal septum.  He also reports symptoms of reflux with partial improvement in his cough with proton pump inhibitor therapy.  He has occupational exposure to dust.  He used to smoke cigarettes, Isaac has second hand tobacco exposure.  His symptoms are suggestive of asthma at this time Isaac previous trial of albuterol didn't seem to help.   Assessment/Plan:   Upper airway cough syndrome with post nasal drip. - he is not able to tolerate montelukast due to mood swings  - previously seen by Dr. Sidney Ace with Allergy/Immunology; will get records from Dr.  Lyla Son office - add astepro in the morning Isaac flonase at night - nasal irrigation bid before using nose sprays - xyzal at night - avoid forcing a cough/clearing throat - sip water with urge to cough - salt water gargle prn - 1 tsp local honey prn for cough  Laryngopharyngeal reflux. - continue prilosec; refilled by his PCP  History of tobacco abuse. - will arrange for chest xray Isaac [name] function test to exclude other causes of his cough  Deviated nasal septum. - might need ENT assessment if his sinus symptoms don't improve  Obstructive sleep apnea. - followed by Dr. Armanda Magic cardiology  Paroxysmal atrial fibrillation. - followed by Dr. Ronnald Ramp O'Neal with cardiology  Time Spent Involved in Patient [name] on Day of Examination:  50 minutes  Follow up:   Patient Instructions  Chest xray today  Astepro 1 spray in each nostril in the morning  Flonase 1 spray in each nostril before going to bed  Use nasal irrigation twice per day before using your nose sprays  Use xyzal at night  Sip water when you have the urge to cough   Avoid forcing a cough or clearing your throat  Salt water gargle once or twice per day as needed  1 teaspoon of local honey as needed to help with cough  Will get medical records from Dr. Debbe Mounts office  Will arrange for [name] function test Isaac follow up in 6 weeks    Medication List:   Allergies as of 08/18/2022       Reactions   Codeine Hives   Singulair [montelukast Sodium] Other (See Comments)   He became very angry         Medication List        Accurate as of August 18, 2022 12:39 PM. If you have any questions, ask your nurse or doctor.          Azelastine HCl 0.15 % Soln Commonly known as: Astepro Place 1 spray into the nose every morning. Started by: Coralyn Helling, MD   FISH OIL PO Take 2,000 capsules by mouth 2 (two) times daily. Super Omega 3   fluticasone 50 MCG/ACT nasal  spray Commonly known as: FLONASE Place 1 spray into both nostrils at bedtime. Started by: Coralyn Helling, MD   metoprolol tartrate 25 MG tablet Commonly known as: LOPRESSOR Take 1 tablet (25 mg) if heart rate is above 100 for 5 minutes.   multivitamin tablet Take 1 tablet by mouth.   omeprazole 20 MG capsule Commonly known as: PRILOSEC Take 1 capsule (20 mg total) by mouth 2 (two) times daily before a meal.   XYZAL PO Take 10 mg by mouth as needed.  Signature:  Chesley Mires, MD [place] Pager - [phone] 08/18/2022, 12:39 PM

## 2022-08-21 DIAGNOSIS — R7309 Other abnormal glucose: Secondary | ICD-10-CM | POA: Diagnosis not present

## 2022-08-25 ENCOUNTER — Encounter (HOSPITAL_BASED_OUTPATIENT_CLINIC_OR_DEPARTMENT_OTHER): Payer: Self-pay | Admitting: Pulmonary Disease

## 2022-08-25 NOTE — Telephone Encounter (Signed)
Pharm team, pt having issues with astepro being filled. Can you please advise if it needs PA or what we can do? Thanks!

## 2022-08-31 ENCOUNTER — Telehealth: Payer: Self-pay

## 2022-08-31 ENCOUNTER — Other Ambulatory Visit (HOSPITAL_COMMUNITY): Payer: Self-pay

## 2022-08-31 NOTE — Telephone Encounter (Signed)
PA request received for Azelastine HCl 0.15% solution via patient.  PA has been submitted via CMM to [facility] and is awaiting determination.   Key: [id number]

## 2022-08-31 NOTE — Telephone Encounter (Signed)
PA started, will be updated in additional encounter.

## 2022-09-05 NOTE — Telephone Encounter (Signed)
PA has been DENIED. Denial letter being sent over to providers office per [facility].

## 2022-09-05 NOTE — Telephone Encounter (Signed)
Prior auth for the azelastine 0.15% solution was denied sir for the patient. We are waiting for the letter to come to office from [facility] and [facility].  Juluis Rainier Sir

## 2022-09-06 NOTE — Telephone Encounter (Signed)
He can buy astepro over the counter.

## 2022-09-08 DIAGNOSIS — G4733 Obstructive sleep apnea (adult) (pediatric): Secondary | ICD-10-CM | POA: Diagnosis not present

## 2022-09-21 DIAGNOSIS — R7309 Other abnormal glucose: Secondary | ICD-10-CM | POA: Diagnosis not present

## 2022-09-25 ENCOUNTER — Ambulatory Visit (INDEPENDENT_AMBULATORY_CARE_PROVIDER_SITE_OTHER): Payer: BC Managed Care – PPO | Admitting: Pulmonary Disease

## 2022-09-25 ENCOUNTER — Encounter (HOSPITAL_BASED_OUTPATIENT_CLINIC_OR_DEPARTMENT_OTHER): Payer: Self-pay | Admitting: Pulmonary Disease

## 2022-09-25 VITALS — BP 110/62 | HR 79 | Temp 98.4°F | Ht 72.0 in | Wt 239.8 lb

## 2022-09-25 DIAGNOSIS — R053 Chronic cough: Secondary | ICD-10-CM

## 2022-09-25 LAB — PULMONARY FUNCTION TEST
DL/VA % pred: 125 %
DL/VA: 5.64 ml/min/mmHg/L
DLCO cor % pred: 103 %
DLCO cor: 32.96 ml/min/mmHg
DLCO unc % pred: 103 %
DLCO unc: 32.96 ml/min/mmHg
FEF 25-75 Post: 4.15 L/sec
FEF 25-75 Pre: 3.72 L/sec
FEF2575-%Change-Post: 11 %
FEF2575-%Pred-Post: 107 %
FEF2575-%Pred-Pre: 96 %
FEV1-%Change-Post: 3 %
FEV1-%Pred-Post: 88 %
FEV1-%Pred-Pre: 85 %
FEV1-Post: 3.83 L
FEV1-Pre: 3.7 L
FEV1FVC-%Change-Post: 1 %
FEV1FVC-%Pred-Pre: 103 %
FEV6-%Change-Post: 1 %
FEV6-%Pred-Post: 86 %
FEV6-%Pred-Pre: 85 %
FEV6-Post: 4.63 L
FEV6-Pre: 4.55 L
FEV6FVC-%Change-Post: 0 %
FEV6FVC-%Pred-Post: 102 %
FEV6FVC-%Pred-Pre: 103 %
FVC-%Change-Post: 2 %
FVC-%Pred-Post: 84 %
FVC-%Pred-Pre: 82 %
FVC-Post: 4.65 L
FVC-Pre: 4.55 L
Post FEV1/FVC ratio: 82 %
Post FEV6/FVC ratio: 100 %
Pre FEV1/FVC ratio: 81 %
Pre FEV6/FVC Ratio: 100 %
RV % pred: 87 %
RV: 1.81 L
TLC % pred: 86 %
TLC: 6.34 L

## 2022-09-25 MED ORDER — QVAR REDIHALER 40 MCG/ACT IN AERB: 2.0000 | INHALATION_SPRAY | Freq: Two times a day (BID) | RESPIRATORY_TRACT | 5 refills | Status: DC

## 2022-09-25 NOTE — Progress Notes (Signed)
Full PFT Performed Today. 

## 2022-09-25 NOTE — Patient Instructions (Signed)
Full PFT Performed Today. 

## 2022-09-25 NOTE — Patient Instructions (Signed)
Qvar two puffs in the morning and two puffs in the evening, and rinse your mouth after each use.  Follow up in 8 weeks.

## 2022-09-25 NOTE — Progress Notes (Signed)
[place] [facility], [facility] Care, and Sleep Medicine  Chief Complaint  Patient presents with   Follow-up    PFT results    Past Surgical History:  He  has a past surgical history that includes Knee arthroscopy with meniscal repair (Left); epidural steroid shot; kidney stones; and Spine surgery.  Past Medical History:  Nephrolithiasis, OSA, Allergic rhinitis, PAF  Constitutional:  BP 110/62 (BP Location: Left Arm, Cuff Size: Large)   Pulse 79   Temp 98.4 F (36.9 C) (Oral)   Ht 6' (1.829 m)   Wt 239 lb 12.8 oz (108.8 kg)   SpO2 99%   BMI 32.52 kg/m   Brief Summary:  Isaac [name] is a 47 y.o. male cigar smoker with chronic cough.      Subjective:   PFT today was normal.  Chest xray from 08/18/22 was normal.  His sinuses are improved.  He is still getting cough and tightness in his chest.  This happens more in the morning, or if he is sitting on his cough for a while.  He feels like he can't get enough air in, and has to force deep breaths.  Physical Exam:   Appearance - well kempt   ENMT - no sinus tenderness, no oral exudate, no LAN, Mallampati 3 airway, no stridor, deviated nasal septum  Respiratory - equal breath sounds bilaterally, no wheezing or rales  CV - s1s2 regular rate and rhythm, no murmurs  Ext - no clubbing, no edema  Skin - no rashes  Psych - normal mood and affect    Pulmonary testing:  PFT 09/25/22 >> FEV1 3.83 (88%), FEV1% 82, TLC 6.34 (86%), DLCO 103%  Chest Imaging:    Sleep Tests:  HST 03/15/22 >> AHI 20.5, SpO2 low 82%  Cardiac Tests:  Echo 01/31/22 >> EF 60 to 65%  Social History:  He  reports that he has been smoking cigars. He has been exposed to tobacco smoke. He has never used smokeless tobacco. He reports current alcohol use. He reports that he does not use drugs.  Family History:  His family history includes Arrhythmia in his mother; Breast cancer in his mother; Cancer in his maternal grandfather, mother, and  paternal grandfather; Stroke in his mother.     Assessment/Plan:   Upper airway cough syndrome with post nasal drip. - he is not able to tolerate montelukast due to mood swings  - previously seen by Dr. Mosetta Anis with Allergy/Immunology - continue astepro in the morning and flonase at night - continue xyzal at night  Laryngopharyngeal reflux. - continue prilosec; refilled by his PCP  History of tobacco abuse. - PFT didn't show COPD - he might still have component of asthma - will try him on Qvar 40 mcg two puffs bid  Deviated nasal septum. - don't think he needs ENT assessment at this time  Obstructive sleep apnea. - followed by Dr. Fransico Him cardiology  Paroxysmal atrial fibrillation. - followed by Dr. Cassie Freer O'Neal with cardiology  Time Spent Involved in Patient Care on Day of Examination:  35 minutes  Follow up:   Patient Instructions  Qvar two puffs in the morning and two puffs in the evening, and rinse your mouth after each use.  Follow up in 8 weeks.  Medication List:   Allergies as of 09/25/2022       Reactions   Codeine Hives   Singulair [montelukast Sodium] Other (See Comments)   He became very angry  Medication List        Accurate as of September 25, 2022  5:14 PM. If you have any questions, ask your nurse or doctor.          Azelastine HCl 0.15 % Soln Commonly known as: Astepro Place 1 spray into the nose every morning.   FISH OIL PO Take 2,000 capsules by mouth 2 (two) times daily. Super Omega 3   fluticasone 50 MCG/ACT nasal spray Commonly known as: FLONASE Place 1 spray into both nostrils at bedtime.   metoprolol tartrate 25 MG tablet Commonly known as: LOPRESSOR Take 1 tablet (25 mg) if heart rate is above 100 for 5 minutes.   multivitamin tablet Take 1 tablet by mouth.   omeprazole 20 MG capsule Commonly known as: PRILOSEC Take 1 capsule (20 mg total) by mouth 2 (two) times daily before a meal.   Qvar  RediHaler 40 MCG/ACT inhaler Generic drug: beclomethasone Inhale 2 puffs into the lungs 2 (two) times daily. Started by: Chesley Mires, MD   XYZAL PO Take 10 mg by mouth as needed.        Signature:  Chesley Mires, MD [place] Pager - [phone] 09/25/2022, 5:14 PM

## 2022-10-20 DIAGNOSIS — R7309 Other abnormal glucose: Secondary | ICD-10-CM | POA: Diagnosis not present

## 2022-10-30 DIAGNOSIS — G4733 Obstructive sleep apnea (adult) (pediatric): Secondary | ICD-10-CM | POA: Diagnosis not present

## 2022-11-06 ENCOUNTER — Ambulatory Visit: Payer: BC Managed Care – PPO | Admitting: Family Medicine

## 2022-11-06 ENCOUNTER — Encounter: Payer: Self-pay | Admitting: Family Medicine

## 2022-11-06 VITALS — BP 118/76 | HR 69 | Temp 97.5°F | Wt 239.0 lb

## 2022-11-06 DIAGNOSIS — J019 Acute sinusitis, unspecified: Secondary | ICD-10-CM | POA: Diagnosis not present

## 2022-11-06 MED ORDER — LEVOFLOXACIN 500 MG PO TABS: 500.0000 mg | ORAL_TABLET | Freq: Every day | ORAL | 0 refills | Status: AC

## 2022-11-06 NOTE — Progress Notes (Signed)
   Subjective:    Patient ID: Isaac [name], male    DOB: 19-May-1976, 47 y.o.   MRN: [id number]  HPI Here for one week of sinus pressure, PND, and blowing green mucus from the nose. No fever. His chronic cough has not been affected. Taking Nyquil and Dayquil.    Review of Systems  Constitutional: Negative.   HENT:  Positive for congestion, facial swelling, postnasal drip and sinus pressure. Negative for ear pain and sore throat.   Eyes: Negative.   Respiratory:  Positive for cough. Negative for shortness of breath and wheezing.        Objective:   Physical Exam Constitutional:      Appearance: Normal appearance. He is not ill-appearing.  HENT:     Right Ear: Tympanic membrane, ear canal and external ear normal.     Left Ear: Tympanic membrane, ear canal and external ear normal.     Nose: Nose normal.     Mouth/Throat:     Pharynx: Oropharynx is clear.  Eyes:     Conjunctiva/sclera: Conjunctivae normal.  Pulmonary:     Effort: Pulmonary effort is normal.     Breath sounds: Normal breath sounds.  Lymphadenopathy:     Cervical: No cervical adenopathy.  Neurological:     Mental Status: He is alert.           Assessment & Plan:  Sinusitis, treat with 10 days of Levaquin. Alysia Penna, MD

## 2022-11-20 DIAGNOSIS — R7309 Other abnormal glucose: Secondary | ICD-10-CM | POA: Diagnosis not present

## 2022-12-01 ENCOUNTER — Encounter (HOSPITAL_BASED_OUTPATIENT_CLINIC_OR_DEPARTMENT_OTHER): Payer: Self-pay | Admitting: Pulmonary Disease

## 2022-12-01 ENCOUNTER — Ambulatory Visit (HOSPITAL_BASED_OUTPATIENT_CLINIC_OR_DEPARTMENT_OTHER): Payer: BC Managed Care – PPO | Admitting: Pulmonary Disease

## 2022-12-01 VITALS — BP 112/82 | HR 77 | Ht 72.0 in | Wt 239.0 lb

## 2022-12-01 DIAGNOSIS — R058 Other specified cough: Secondary | ICD-10-CM

## 2022-12-01 DIAGNOSIS — J4540 Moderate persistent asthma, uncomplicated: Secondary | ICD-10-CM | POA: Diagnosis not present

## 2022-12-01 MED ORDER — FLUTICASONE-SALMETEROL 250-50 MCG/ACT IN AEPB: 1.0000 | INHALATION_SPRAY | Freq: Two times a day (BID) | RESPIRATORY_TRACT | 5 refills | Status: AC

## 2022-12-01 NOTE — Patient Instructions (Signed)
Wixela 1 puff twice per day, and rinse your mouth after each use  Follow up in 4 to 5 months

## 2022-12-01 NOTE — Progress Notes (Signed)
[place] [facility], Critical Care, and Sleep Medicine  Chief Complaint  Patient presents with   Follow-up    Question about inhaler  had respiratory gastro bug week or 2 ago    Past Surgical History:  He  has a past surgical history that includes Knee arthroscopy with meniscal repair (Left); epidural steroid shot; kidney stones; and Spine surgery.  Past Medical History:  Nephrolithiasis, OSA, Allergic rhinitis, PAF  Constitutional:  BP 112/82 (BP Location: Left Arm, Cuff Size: Large)   Pulse 77   Ht 6' (1.829 m)   Wt 239 lb (108.4 kg)   SpO2 97%   BMI 32.41 kg/m   Brief Summary:  Isaac [name] is a 47 y.o. male cigar smoker with chronic cough from asthma, allergic rhinitis with post nasal drip and deviated nasal septum, and laryngopharyngeal reflux.      Subjective:   Cough improved with nasal sprays and Qvar.  He ran out of Qvar and symptoms got some worse.  He feels like he needs to be on inhaler again.  Sinuses okay.  Not having chest pain or wheeze.  Still gets some cough with clear sputum.  Sleeping okay.  Physical Exam:   Appearance - well kempt   ENMT - no sinus tenderness, no oral exudate, no LAN, Mallampati 3 airway, no stridor, deviated nasal septum  Respiratory - equal breath sounds bilaterally, no wheezing or rales  CV - s1s2 regular rate and rhythm, no murmurs  Ext - no clubbing, no edema  Skin - no rashes  Psych - normal mood and affect    Pulmonary testing:  PFT 09/25/22 >> FEV1 3.83 (88%), FEV1% 82, TLC 6.34 (86%), DLCO 103%  Chest Imaging:    Sleep Tests:  HST 03/15/22 >> AHI 20.5, SpO2 low 82%  Cardiac Tests:  Echo 01/31/22 >> EF 60 to 65%  Social History:  He  reports that he has been smoking cigars. He has been exposed to tobacco smoke. He has never used smokeless tobacco. He reports current alcohol use. He reports that he does not use drugs.  Family History:  His family history includes Arrhythmia in his mother; Breast cancer in  his mother; Cancer in his maternal grandfather, mother, and paternal grandfather; Stroke in his mother.     Assessment/Plan:   Moderate, persistent asthma. - will change from Qvar to Wixela 250 one puff bid  Upper airway cough syndrome with post nasal drip. - he is not able to tolerate montelukast due to mood swings  - previously seen by Dr. Sidney Ace with Allergy/Immunology - continue astepro in the morning and flonase at night - continue xyzal at night - continue nasal irrigation  Laryngopharyngeal reflux. - continue prilosec; refilled by his PCP  Deviated nasal septum. - don't think he needs ENT assessment at this time  Obstructive sleep apnea. - followed by Dr. Armanda Magic cardiology  Paroxysmal atrial fibrillation. - followed by Dr. Ronnald Ramp O'Neal with cardiology  Time Spent Involved in Patient Care on Day of Examination:  27 minutes  Follow up:   Patient Instructions  Wixela 1 puff twice per day, and rinse your mouth after each use  Follow up in 4 to 5 months  Medication List:   Allergies as of 12/01/2022       Reactions   Codeine Hives   Singulair [montelukast Sodium] Other (See Comments)   He became very angry         Medication List  Accurate as of December 01, 2022  4:32 PM. If you have any questions, ask your nurse or doctor.          STOP taking these medications    Qvar RediHaler 40 MCG/ACT inhaler Generic drug: beclomethasone Stopped by: Coralyn Helling, MD       TAKE these medications    Azelastine HCl 0.15 % Soln Commonly known as: Astepro Place 1 spray into the nose every morning.   FISH OIL PO Take 2,000 capsules by mouth 2 (two) times daily. Super Omega 3   fluticasone 50 MCG/ACT nasal spray Commonly known as: FLONASE Place 1 spray into both nostrils at bedtime.   fluticasone-salmeterol 250-50 MCG/ACT Aepb Commonly known as: Wixela Inhub Inhale 1 puff into the lungs in the morning and at bedtime. Started  by: Coralyn Helling, MD   metoprolol tartrate 25 MG tablet Commonly known as: LOPRESSOR Take 1 tablet (25 mg) if heart rate is above 100 for 5 minutes.   multivitamin tablet Take 1 tablet by mouth.   omeprazole 20 MG capsule Commonly known as: PRILOSEC Take 1 capsule (20 mg total) by mouth 2 (two) times daily before a meal.   XYZAL PO Take 10 mg by mouth as needed.        Signature:  Coralyn Helling, MD [facility] Pulmonary/Critical Care Pager - [phone] 12/01/2022, 4:32 PM

## 2022-12-20 DIAGNOSIS — M205X2 Other deformities of toe(s) (acquired), left foot: Secondary | ICD-10-CM | POA: Diagnosis not present

## 2022-12-20 DIAGNOSIS — M109 Gout, unspecified: Secondary | ICD-10-CM | POA: Diagnosis not present

## 2022-12-20 DIAGNOSIS — R7309 Other abnormal glucose: Secondary | ICD-10-CM | POA: Diagnosis not present

## 2023-01-02 ENCOUNTER — Encounter: Payer: Self-pay | Admitting: Family Medicine

## 2023-01-04 ENCOUNTER — Other Ambulatory Visit: Payer: Self-pay

## 2023-01-04 MED ORDER — COLCHICINE 0.6 MG PO TABS: 0.6000 mg | ORAL_TABLET | Freq: Every day | ORAL | 3 refills | Status: AC

## 2023-01-04 NOTE — Telephone Encounter (Signed)
He can try Colchicine for prevention. Call in Colchicine 0.6 mg to take daily, #90 with 3 rf

## 2023-01-05 NOTE — Telephone Encounter (Signed)
He can continue to take Ibuprofen or Tylenol  as needed

## 2023-01-20 DIAGNOSIS — R7309 Other abnormal glucose: Secondary | ICD-10-CM | POA: Diagnosis not present

## 2023-01-26 ENCOUNTER — Encounter: Payer: Self-pay | Admitting: Family Medicine

## 2023-01-27 DIAGNOSIS — R21 Rash and other nonspecific skin eruption: Secondary | ICD-10-CM | POA: Diagnosis not present

## 2023-02-19 DIAGNOSIS — R7309 Other abnormal glucose: Secondary | ICD-10-CM | POA: Diagnosis not present

## 2023-03-01 ENCOUNTER — Encounter: Payer: Self-pay | Admitting: Family Medicine

## 2023-03-01 ENCOUNTER — Ambulatory Visit: Payer: BC Managed Care – PPO | Admitting: Family Medicine

## 2023-03-01 VITALS — BP 118/80 | HR 78 | Temp 98.1°F | Wt 242.0 lb

## 2023-03-01 DIAGNOSIS — L309 Dermatitis, unspecified: Secondary | ICD-10-CM

## 2023-03-01 NOTE — Progress Notes (Signed)
   Subjective:    Patient ID: Isaac [name], male    DOB: 10-13-1975, 47 y.o.   MRN: [id number]  HPI Here to check a rash that began about 6 weeks ago. He first noticed it on his upper lip and then another area on the right forearm. They itch at times. He had a teledoc visit ans was prescribed Triamcinolone cream. This works well to make the spots disappear, but then they come back.    Review of Systems  Constitutional: Negative.   Respiratory: Negative.    Cardiovascular: Negative.   Skin:  Positive for rash.       Objective:   Physical Exam Constitutional:      Appearance: Normal appearance.  Cardiovascular:     Rate and Rhythm: Normal rate and regular rhythm.     Pulses: Normal pulses.     Heart sounds: Normal heart sounds.  Pulmonary:     Effort: Pulmonary effort is normal.     Breath sounds: Normal breath sounds.  Skin:    Comments: The upper lip is clear. These is a 2 cm round red scaly macular spot on the right forearm   Neurological:     Mental Status: He is alert.           Assessment & Plan:  This is eczema. I told him the Triamcinolone cream is an excellent way to treat this.I also told him this cannot be "cured" but it can be controlled when it flares up.  Gershon Crane, MD'

## 2023-03-22 DIAGNOSIS — R7309 Other abnormal glucose: Secondary | ICD-10-CM | POA: Diagnosis not present

## 2023-03-23 ENCOUNTER — Encounter: Payer: Self-pay | Admitting: Cardiology

## 2023-04-22 DIAGNOSIS — R7309 Other abnormal glucose: Secondary | ICD-10-CM | POA: Diagnosis not present

## 2023-04-27 DIAGNOSIS — B354 Tinea corporis: Secondary | ICD-10-CM | POA: Diagnosis not present

## 2023-04-27 DIAGNOSIS — K130 Diseases of lips: Secondary | ICD-10-CM | POA: Diagnosis not present

## 2023-05-18 ENCOUNTER — Encounter: Payer: Self-pay | Admitting: Family Medicine

## 2023-05-21 NOTE — Telephone Encounter (Signed)
Make him an in person OV so we can discuss these

## 2023-05-22 DIAGNOSIS — R7309 Other abnormal glucose: Secondary | ICD-10-CM | POA: Diagnosis not present

## 2023-05-23 NOTE — Telephone Encounter (Signed)
Noted  

## 2023-05-24 ENCOUNTER — Ambulatory Visit: Payer: BC Managed Care – PPO | Admitting: Family Medicine

## 2023-05-24 ENCOUNTER — Encounter: Payer: Self-pay | Admitting: Family Medicine

## 2023-05-24 VITALS — BP 118/80 | HR 77 | Temp 98.2°F | Wt 243.0 lb

## 2023-05-24 DIAGNOSIS — E785 Hyperlipidemia, unspecified: Secondary | ICD-10-CM

## 2023-05-24 MED ORDER — ATORVASTATIN CALCIUM 10 MG PO TABS: 10.0000 mg | ORAL_TABLET | Freq: Every day | ORAL | 3 refills | Status: DC

## 2023-05-24 NOTE — Progress Notes (Signed)
Subjective:    Patient ID: Isaac [name], male    DOB: [birth year], 47 y.o.   MRN: [id number]  HPI Here to go over some lab results that he had after an exam to qualify for life insurance. These were mostly unremarkable, but his lipids were elevated as usual. His total cholesterol was 283, LDL 162, HDL 58, and TG 311. He says he eats a fairly healthy diet. He has had elevated LDL's with [place] as well.    Review of Systems  Constitutional: Negative.   Respiratory: Negative.    Cardiovascular: Negative.        Objective:   Physical Exam Constitutional:      Appearance: Normal appearance.  Cardiovascular:     Rate and Rhythm: Normal rate and regular rhythm.     Pulses: Normal pulses.     Heart sounds: Normal heart sounds.  Pulmonary:     Effort: Pulmonary effort is normal.     Breath sounds: Normal breath sounds.  Neurological:     Mental Status: He is alert.           Assessment & Plan:  Dyslipidemia, we agreed to start him on Lipitor 10 mg daily. He will set up a well exam with fasting labs in about 60 days, so we can recheck this at that time. Gershon Crane, MD

## 2023-06-11 ENCOUNTER — Encounter: Payer: Self-pay | Admitting: Family Medicine

## 2023-06-11 NOTE — Telephone Encounter (Signed)
Call in Augmentin 875 BID for 10 days  

## 2023-06-13 MED ORDER — AMOXICILLIN-POT CLAVULANATE 875-125 MG PO TABS: 1.0000 | ORAL_TABLET | Freq: Two times a day (BID) | ORAL | 0 refills | Status: DC

## 2023-06-22 DIAGNOSIS — R7309 Other abnormal glucose: Secondary | ICD-10-CM | POA: Diagnosis not present

## 2023-07-22 DIAGNOSIS — R7309 Other abnormal glucose: Secondary | ICD-10-CM | POA: Diagnosis not present

## 2023-07-25 ENCOUNTER — Ambulatory Visit: Payer: BC Managed Care – PPO | Admitting: Family Medicine

## 2023-07-25 ENCOUNTER — Encounter: Payer: Self-pay | Admitting: Family Medicine

## 2023-07-25 VITALS — BP 100/78 | Temp 97.6°F | Ht 73.0 in | Wt 249.0 lb

## 2023-07-25 DIAGNOSIS — Z0000 Encounter for general adult medical examination without abnormal findings: Secondary | ICD-10-CM | POA: Diagnosis not present

## 2023-07-25 DIAGNOSIS — M1000 Idiopathic gout, unspecified site: Secondary | ICD-10-CM | POA: Diagnosis not present

## 2023-07-25 DIAGNOSIS — Z23 Encounter for immunization: Secondary | ICD-10-CM | POA: Diagnosis not present

## 2023-07-25 DIAGNOSIS — R09A2 Foreign body sensation, throat: Secondary | ICD-10-CM | POA: Diagnosis not present

## 2023-07-25 DIAGNOSIS — K219 Gastro-esophageal reflux disease without esophagitis: Secondary | ICD-10-CM | POA: Diagnosis not present

## 2023-07-25 LAB — BASIC METABOLIC PANEL
BUN: 20 mg/dL (ref 6–23)
CO2: 31 meq/L (ref 19–32)
Calcium: 9.6 mg/dL (ref 8.4–10.5)
Chloride: 101 meq/L (ref 96–112)
Creatinine, Ser: 1.36 mg/dL (ref 0.40–1.50)
GFR: 61.89 mL/min (ref >60.00)
Glucose, Bld: 96 mg/dL (ref 70–99)
Potassium: 4.2 meq/L (ref 3.5–5.1)
Sodium: 140 meq/L (ref 135–145)

## 2023-07-25 LAB — LIPID PANEL
Cholesterol: 180 mg/dL (ref 0–200)
HDL: 57 mg/dL (ref >39.00)
LDL Cholesterol: 89 mg/dL (ref 0–99)
NonHDL: 122.97
Total CHOL/HDL Ratio: 3
Triglycerides: 169 mg/dL — ABNORMAL HIGH (ref 0.0–149.0)
VLDL: 33.8 mg/dL (ref 0.0–40.0)

## 2023-07-25 LAB — CBC WITH DIFFERENTIAL/PLATELET
Basophils Absolute: 0.1 10*3/uL (ref 0.0–0.1)
Basophils Relative: 1 % (ref 0.0–3.0)
Eosinophils Absolute: 0.2 10*3/uL (ref 0.0–0.7)
Eosinophils Relative: 3.6 % (ref 0.0–5.0)
HCT: 49.6 % (ref 39.0–52.0)
Hemoglobin: 16.4 g/dL (ref 13.0–17.0)
Lymphocytes Relative: 29.7 % (ref 12.0–46.0)
Lymphs Abs: 1.9 10*3/uL (ref 0.7–4.0)
MCHC: 33.1 g/dL (ref 30.0–36.0)
MCV: 90.9 fL (ref 78.0–100.0)
Monocytes Absolute: 0.7 10*3/uL (ref 0.1–1.0)
Monocytes Relative: 10.5 % (ref 3.0–12.0)
Neutro Abs: 3.5 10*3/uL (ref 1.4–7.7)
Neutrophils Relative %: 55.2 % (ref 43.0–77.0)
Platelets: 227 10*3/uL (ref 150.0–400.0)
RBC: 5.46 Mil/uL (ref 4.22–5.81)
RDW: 13.3 % (ref 11.5–15.5)
WBC: 6.3 10*3/uL (ref 4.0–10.5)

## 2023-07-25 LAB — HEPATIC FUNCTION PANEL
ALT: 72 U/L — ABNORMAL HIGH (ref 0–53)
AST: 32 U/L (ref 0–37)
Albumin: 4.3 g/dL (ref 3.5–5.2)
Alkaline Phosphatase: 82 U/L (ref 39–117)
Bilirubin, Direct: 0.2 mg/dL (ref 0.0–0.3)
Total Bilirubin: 0.9 mg/dL (ref 0.2–1.2)
Total Protein: 7 g/dL (ref 6.0–8.3)

## 2023-07-25 LAB — TSH: TSH: 2.64 u[IU]/mL (ref 0.35–5.50)

## 2023-07-25 LAB — URIC ACID: Uric Acid, Serum: 9.8 mg/dL — ABNORMAL HIGH (ref 4.0–7.8)

## 2023-07-25 LAB — HEMOGLOBIN A1C: Hgb A1c MFr Bld: 5.9 % (ref 4.6–6.5)

## 2023-07-25 MED ORDER — OMEPRAZOLE 20 MG PO CPDR: 20.0000 mg | DELAYED_RELEASE_CAPSULE | Freq: Two times a day (BID) | ORAL | 3 refills | Status: DC

## 2023-07-25 NOTE — Progress Notes (Signed)
Subjective:    Patient ID: AVERETT [name], male    DOB: [birth year], 47 y.o.   MRN: [id number]  HPI Here for a well exam. He feels well in general. He started on Lipitor 2 months ago so we will check his lipids today. He does describe a sensation of a lump in the throat several times a week. This started a month ago. It lasts 1-2 hours and then goes away. This does not affect his swallowing or speaking.    Review of Systems  Constitutional: Negative.   HENT: Negative.    Eyes: Negative.   Respiratory: Negative.    Cardiovascular: Negative.   Gastrointestinal: Negative.   Genitourinary: Negative.   Musculoskeletal: Negative.   Skin: Negative.   Neurological: Negative.   Psychiatric/Behavioral: Negative.         Objective:   Physical Exam Constitutional:      General: He is not in acute distress.    Appearance: Normal appearance. He is well-developed. He is not diaphoretic.  HENT:     Head: Normocephalic and atraumatic.     Right Ear: External ear normal.     Left Ear: External ear normal.     Nose: Nose normal.     Mouth/Throat:     Pharynx: No oropharyngeal exudate.  Eyes:     General: No scleral icterus.       Right eye: No discharge.        Left eye: No discharge.     Conjunctiva/sclera: Conjunctivae normal.     Pupils: Pupils are equal, round, and reactive to light.  Neck:     Thyroid: No thyromegaly.     Vascular: No JVD.     Trachea: No tracheal deviation.  Cardiovascular:     Rate and Rhythm: Normal rate and regular rhythm.     Pulses: Normal pulses.     Heart sounds: Normal heart sounds. No murmur heard.    No friction rub. No gallop.  Pulmonary:     Effort: Pulmonary effort is normal. No respiratory distress.     Breath sounds: Normal breath sounds. No wheezing or rales.  Chest:     Chest wall: No tenderness.  Abdominal:     General: Bowel sounds are normal. There is no distension.     Palpations: Abdomen is soft. There is no mass.     Tenderness:  There is no abdominal tenderness. There is no guarding or rebound.  Genitourinary:    Penis: Normal. No tenderness.      Testes: Normal.     Prostate: Normal.     Rectum: Normal. Guaiac result negative.  Musculoskeletal:        General: No tenderness. Normal range of motion.     Cervical back: Neck supple.  Lymphadenopathy:     Cervical: No cervical adenopathy.  Skin:    General: Skin is warm and dry.     Coloration: Skin is not pale.     Findings: No erythema or rash.  Neurological:     General: No focal deficit present.     Mental Status: He is alert and oriented to person, place, and time.     Cranial Nerves: No cranial nerve deficit.     Motor: No abnormal muscle tone.     Coordination: Coordination normal.     Deep Tendon Reflexes: Reflexes are normal and symmetric. Reflexes normal.  Psychiatric:        Mood and Affect: Mood normal.  Behavior: Behavior normal.        Thought Content: Thought content normal.        Judgment: Judgment normal.           Assessment & Plan:  Well exam. We discussed diet and exercise. Get fasting labs. He seems oe have developed a globus sensation. We discussed this, and he will let us know if it gets any worse.  Isaac Crane, MD

## 2023-07-25 NOTE — Addendum Note (Signed)
Addended by: Carola Rhine on: 07/25/2023 10:57 AM   Modules accepted: Orders

## 2023-08-02 ENCOUNTER — Encounter: Payer: Self-pay | Admitting: Family Medicine

## 2023-08-03 NOTE — Telephone Encounter (Signed)
I don't think we need to be concerned about the elevated ALT, but we will keep an eye on it. Sometimes an isolated bump in the ALT can be from alcohol use. Let's repeat a liver panel in 90 days.

## 2023-09-12 ENCOUNTER — Encounter: Payer: Self-pay | Admitting: Family Medicine

## 2023-09-12 MED ORDER — ALLOPURINOL 100 MG PO TABS: 100.0000 mg | ORAL_TABLET | Freq: Every day | ORAL | 3 refills | Status: DC

## 2023-09-12 NOTE — Telephone Encounter (Signed)
Done

## 2023-10-03 DIAGNOSIS — G4733 Obstructive sleep apnea (adult) (pediatric): Secondary | ICD-10-CM | POA: Diagnosis not present

## 2023-10-16 DIAGNOSIS — F4323 Adjustment disorder with mixed anxiety and depressed mood: Secondary | ICD-10-CM | POA: Diagnosis not present

## 2024-04-30 ENCOUNTER — Other Ambulatory Visit: Payer: Self-pay | Admitting: Family Medicine

## 2024-05-22 ENCOUNTER — Other Ambulatory Visit: Payer: Self-pay | Admitting: Family Medicine

## 2024-05-22 DIAGNOSIS — K219 Gastro-esophageal reflux disease without esophagitis: Secondary | ICD-10-CM

## 2024-06-27 ENCOUNTER — Encounter: Payer: Self-pay | Admitting: Family Medicine

## 2024-06-27 ENCOUNTER — Ambulatory Visit: Admitting: Family Medicine

## 2024-06-27 VITALS — BP 126/74 | HR 82 | Temp 98.1°F | Wt 254.0 lb

## 2024-06-27 DIAGNOSIS — K219 Gastro-esophageal reflux disease without esophagitis: Secondary | ICD-10-CM | POA: Diagnosis not present

## 2024-06-27 DIAGNOSIS — N4889 Other specified disorders of penis: Secondary | ICD-10-CM | POA: Diagnosis not present

## 2024-06-27 MED ORDER — PANTOPRAZOLE SODIUM 40 MG PO TBEC: 40.0000 mg | DELAYED_RELEASE_TABLET | Freq: Two times a day (BID) | ORAL | 3 refills | Status: AC

## 2024-06-27 NOTE — Progress Notes (Signed)
   Subjective:    Patient ID: Isaac [name], male    DOB: [birth year], 48 y.o.   MRN: [id number]  HPI Here for 2 issues. First he has had GERD symptoms for several years, and normally Omeprazole  works well. He is taking Omeprazole  20 mg BID. However for the past 3 weeks he has had frequent burning pains in the epigastrium. No nausea. No trouble swallowing. Eating food has no effect on these pains. His BM's are regular. Also about a month ago he noticed a small firm lump under the skin on his penis. This can be painful when he has intercourse. No urinary problems.    Review of Systems  Constitutional: Negative.   Respiratory: Negative.    Cardiovascular: Negative.   Gastrointestinal:  Positive for abdominal pain. Negative for abdominal distention, blood in stool, constipation, diarrhea and nausea.  Genitourinary:  Positive for penile pain. Negative for difficulty urinating, dysuria, flank pain, hematuria, penile discharge, testicular pain and urgency.       Objective:   Physical Exam Constitutional:      Appearance: Normal appearance.  Cardiovascular:     Rate and Rhythm: Normal rate and regular rhythm.     Pulses: Normal pulses.     Heart sounds: Normal heart sounds.  Pulmonary:     Effort: Pulmonary effort is normal.  Genitourinary:    Comments: The left lateral penis has a 5 mm X 5 mm firm mobile lump just under the skin that is mildly tender. This is just proximal to the glans Neurological:     Mental Status: He is alert.           Assessment & Plan:  His GERD has worsened, so we will stop the Omeprazole . Instead he will take Pantoprazole 40 mg BID and report back in 2 weeks. Refer to Urology for the painful penile lesion.  Garnette Olmsted, MD

## 2024-07-29 ENCOUNTER — Encounter: Payer: Self-pay | Admitting: Family Medicine

## 2024-07-31 MED ORDER — ALLOPURINOL 300 MG PO TABS: 300.0000 mg | ORAL_TABLET | Freq: Every day | ORAL | 3 refills | Status: AC

## 2024-07-31 MED ORDER — METHYLPREDNISOLONE 4 MG PO TBPK: ORAL_TABLET | ORAL | 0 refills | Status: AC

## 2024-07-31 NOTE — Telephone Encounter (Signed)
 For the acute episode, I sent in a Medrol  dose pack. For prevention, I increased the Allopurinol  to 300 mg daily
